# Patient Record
Sex: Male | Born: 1950 | Race: Black or African American | Hispanic: No | Marital: Single | Smoking: Never smoker
Health system: Southern US, Community
[De-identification: ages and names within clinical notes are randomized; demographics above are authoritative.]

## PROBLEM LIST (undated history)

## (undated) DIAGNOSIS — I10 Essential (primary) hypertension: Secondary | ICD-10-CM

## (undated) DIAGNOSIS — M1990 Unspecified osteoarthritis, unspecified site: Secondary | ICD-10-CM

## (undated) DIAGNOSIS — M109 Gout, unspecified: Secondary | ICD-10-CM

## (undated) HISTORY — DX: Unspecified osteoarthritis, unspecified site: M19.90

## (undated) HISTORY — PX: WRIST SURGERY: SHX841

---

## 2000-10-21 ENCOUNTER — Encounter: Payer: Self-pay | Admitting: Family Medicine

## 2000-10-21 ENCOUNTER — Ambulatory Visit (HOSPITAL_COMMUNITY): Admission: RE | Admit: 2000-10-21 | Discharge: 2000-10-21 | Payer: Self-pay | Admitting: Family Medicine

## 2001-05-07 ENCOUNTER — Emergency Department (HOSPITAL_COMMUNITY): Admission: EM | Admit: 2001-05-07 | Discharge: 2001-05-07 | Payer: Self-pay | Admitting: *Deleted

## 2001-10-26 ENCOUNTER — Emergency Department (HOSPITAL_COMMUNITY): Admission: EM | Admit: 2001-10-26 | Discharge: 2001-10-26 | Payer: Self-pay | Admitting: Emergency Medicine

## 2002-01-18 ENCOUNTER — Emergency Department (HOSPITAL_COMMUNITY): Admission: EM | Admit: 2002-01-18 | Discharge: 2002-01-18 | Payer: Self-pay | Admitting: Emergency Medicine

## 2002-08-23 ENCOUNTER — Emergency Department (HOSPITAL_COMMUNITY): Admission: EM | Admit: 2002-08-23 | Discharge: 2002-08-23 | Payer: Self-pay | Admitting: Emergency Medicine

## 2004-03-22 ENCOUNTER — Emergency Department (HOSPITAL_COMMUNITY): Admission: EM | Admit: 2004-03-22 | Discharge: 2004-03-22 | Payer: Self-pay | Admitting: Emergency Medicine

## 2006-03-28 ENCOUNTER — Emergency Department (HOSPITAL_COMMUNITY): Admission: EM | Admit: 2006-03-28 | Discharge: 2006-03-28 | Payer: Self-pay | Admitting: Emergency Medicine

## 2006-06-09 ENCOUNTER — Emergency Department (HOSPITAL_COMMUNITY): Admission: EM | Admit: 2006-06-09 | Discharge: 2006-06-10 | Payer: Self-pay | Admitting: Emergency Medicine

## 2006-08-28 ENCOUNTER — Emergency Department (HOSPITAL_COMMUNITY): Admission: EM | Admit: 2006-08-28 | Discharge: 2006-08-28 | Payer: Self-pay | Admitting: Emergency Medicine

## 2007-07-25 ENCOUNTER — Emergency Department (HOSPITAL_COMMUNITY): Admission: EM | Admit: 2007-07-25 | Discharge: 2007-07-25 | Payer: Self-pay | Admitting: Emergency Medicine

## 2007-10-09 ENCOUNTER — Emergency Department (HOSPITAL_COMMUNITY): Admission: EM | Admit: 2007-10-09 | Discharge: 2007-10-09 | Payer: Self-pay | Admitting: Emergency Medicine

## 2008-06-15 ENCOUNTER — Ambulatory Visit: Payer: Self-pay | Admitting: Internal Medicine

## 2008-06-17 ENCOUNTER — Ambulatory Visit: Payer: Self-pay | Admitting: *Deleted

## 2008-07-07 ENCOUNTER — Ambulatory Visit: Payer: Self-pay | Admitting: Internal Medicine

## 2008-07-07 LAB — CONVERTED CEMR LAB
ALT: 33 units/L (ref 0–53)
Albumin: 4.2 g/dL (ref 3.5–5.2)
CO2: 21 meq/L (ref 19–32)
Calcium: 9.3 mg/dL (ref 8.4–10.5)
Chloride: 106 meq/L (ref 96–112)
Glucose, Bld: 106 mg/dL — ABNORMAL HIGH (ref 70–99)
Potassium: 4.9 meq/L (ref 3.5–5.3)
Sodium: 139 meq/L (ref 135–145)
Total Protein: 8.8 g/dL — ABNORMAL HIGH (ref 6.0–8.3)
Uric Acid, Serum: 11.8 mg/dL — ABNORMAL HIGH (ref 4.0–7.8)

## 2008-08-12 ENCOUNTER — Ambulatory Visit: Payer: Self-pay | Admitting: Internal Medicine

## 2008-09-13 ENCOUNTER — Ambulatory Visit: Payer: Self-pay | Admitting: Internal Medicine

## 2008-09-20 ENCOUNTER — Ambulatory Visit: Payer: Self-pay | Admitting: Internal Medicine

## 2008-10-10 ENCOUNTER — Ambulatory Visit: Payer: Self-pay | Admitting: Family Medicine

## 2008-12-21 ENCOUNTER — Ambulatory Visit: Payer: Self-pay | Admitting: Internal Medicine

## 2008-12-22 ENCOUNTER — Ambulatory Visit (HOSPITAL_COMMUNITY): Admission: RE | Admit: 2008-12-22 | Discharge: 2008-12-22 | Payer: Self-pay | Admitting: Internal Medicine

## 2009-01-04 ENCOUNTER — Ambulatory Visit: Payer: Self-pay | Admitting: Internal Medicine

## 2009-01-11 ENCOUNTER — Ambulatory Visit: Payer: Self-pay | Admitting: Internal Medicine

## 2009-02-02 ENCOUNTER — Ambulatory Visit: Payer: Self-pay | Admitting: Internal Medicine

## 2009-02-10 ENCOUNTER — Encounter: Admission: RE | Admit: 2009-02-10 | Discharge: 2009-05-11 | Payer: Self-pay | Admitting: Family Medicine

## 2009-03-15 ENCOUNTER — Ambulatory Visit: Payer: Self-pay | Admitting: Internal Medicine

## 2009-03-30 ENCOUNTER — Ambulatory Visit: Payer: Self-pay | Admitting: Internal Medicine

## 2011-03-31 ENCOUNTER — Emergency Department (HOSPITAL_COMMUNITY)
Admission: EM | Admit: 2011-03-31 | Discharge: 2011-03-31 | Disposition: A | Discharge status: Home / Self Care | Payer: Medicare Other | Attending: Emergency Medicine | Admitting: Emergency Medicine

## 2011-03-31 DIAGNOSIS — M109 Gout, unspecified: Secondary | ICD-10-CM | POA: Insufficient documentation

## 2011-03-31 DIAGNOSIS — M7989 Other specified soft tissue disorders: Secondary | ICD-10-CM | POA: Insufficient documentation

## 2011-03-31 DIAGNOSIS — M25569 Pain in unspecified knee: Secondary | ICD-10-CM | POA: Insufficient documentation

## 2011-03-31 DIAGNOSIS — M25579 Pain in unspecified ankle and joints of unspecified foot: Secondary | ICD-10-CM | POA: Insufficient documentation

## 2011-05-24 LAB — I-STAT 8, (EC8 V) (CONVERTED LAB)
BUN: 24 — ABNORMAL HIGH
Bicarbonate: 22
Chloride: 106
Glucose, Bld: 119 — ABNORMAL HIGH
HCT: 36 — ABNORMAL LOW
Hemoglobin: 12.2 — ABNORMAL LOW
Operator id: 270651
Sodium: 138
pCO2, Ven: 33 — ABNORMAL LOW

## 2011-05-24 LAB — URINE MICROSCOPIC-ADD ON

## 2011-05-24 LAB — URINE CULTURE

## 2011-05-24 LAB — URINALYSIS, ROUTINE W REFLEX MICROSCOPIC
Ketones, ur: 40 — AB
Nitrite: POSITIVE — AB
Protein, ur: 100 — AB

## 2011-10-15 ENCOUNTER — Ambulatory Visit (INDEPENDENT_AMBULATORY_CARE_PROVIDER_SITE_OTHER): Payer: Medicare Other | Admitting: Family Medicine

## 2011-10-15 ENCOUNTER — Encounter: Payer: Self-pay | Admitting: Family Medicine

## 2011-10-15 DIAGNOSIS — I10 Essential (primary) hypertension: Secondary | ICD-10-CM | POA: Diagnosis not present

## 2011-10-15 DIAGNOSIS — M109 Gout, unspecified: Secondary | ICD-10-CM | POA: Diagnosis not present

## 2011-10-15 DIAGNOSIS — IMO0001 Reserved for inherently not codable concepts without codable children: Secondary | ICD-10-CM | POA: Insufficient documentation

## 2011-10-15 LAB — BASIC METABOLIC PANEL
Calcium: 9.4 mg/dL (ref 8.4–10.5)
Glucose, Bld: 107 mg/dL — ABNORMAL HIGH (ref 70–99)
Potassium: 4 mEq/L (ref 3.5–5.3)
Sodium: 139 mEq/L (ref 135–145)

## 2011-10-15 LAB — URIC ACID: Uric Acid, Serum: 8.9 mg/dL — ABNORMAL HIGH (ref 4.0–7.8)

## 2011-10-15 MED ORDER — ALLOPURINOL 100 MG PO TABS: 100.0000 mg | ORAL_TABLET | Freq: Every day | ORAL | Status: DC

## 2011-10-15 MED ORDER — INDOMETHACIN 25 MG PO CAPS: 25.0000 mg | ORAL_CAPSULE | Freq: Two times a day (BID) | ORAL | Status: DC

## 2011-10-15 NOTE — Progress Notes (Signed)
  Subjective:    Patient ID: Cody Bautista, male    DOB: 07/02/1951, 61 y.o.   MRN: [id number]  Arthritis He complains of joint swelling. Pertinent negatives include no fever.  Thsi 61 y.o AA male has a long hx of Gout which affects primarily his knees, ankles, elbows and his right sholuder.  His has changed his diet to reduce the offending agents in his diet. He has run out of the meds that were very effective-   swelling gone down and no burning sensation in his knees. Denies pain in joints today.  Re: Elevated BP- pt states he sausage 2 days ago as the reason for his elevated reading today. Also he has  stressful neighbors and he is moving into a house next week.   HCM- last CPE about 2-3 years ago at [facility] with Dr. Donia Bautista    Review of Systems  Constitutional: Negative for fever, appetite change and unexpected weight change.  Respiratory: Positive for cough. Negative for chest tightness, shortness of breath and wheezing.        C/o cough when having a bowel movement x 5-6 months  Cardiovascular: Negative.   Musculoskeletal: Positive for back pain, joint swelling, arthralgias and arthritis.  Neurological: Negative.   Psychiatric/Behavioral: Positive for sleep disturbance.       Objective:   Physical Exam  Nursing note and vitals reviewed. Constitutional: He is oriented to person, place, and time. He appears well-developed and well-nourished. No distress.       Slightly disheveled appearance  HENT:  Head: Normocephalic and atraumatic.  Eyes: Conjunctivae and EOM are normal.  Neck: Normal range of motion.  Pulmonary/Chest: Effort normal.  Musculoskeletal: Normal range of motion. He exhibits no edema and no tenderness.       Shoulders- stiffness noted in right  with decreased ROM;                    Give-way against resisitance Spasms noted in paraspinal muscles; forward flexion very limited to less than 30 degrees.  Neurological: He is  alert and oriented to person, place, and time. No cranial nerve deficit.  Skin: Skin is warm and dry.  Psychiatric: He has a normal mood and affect. His behavior is normal. Thought content normal.    Uric acid = 7.5 mg/dL  in November 2012 ESR= 120 mm/hr  in November 2012.      Assessment & Plan:   1. Gout  Uric acid; Resume Indomethacin. Will start Allopurinol at 100 mg daily pending labs.  Elevated Uric acid = 11.8 In November 2009.   2. High blood pressure  Basic metabolic panel: BP recheck= 160/98.   3.  RTC in 8 weeks for CPE and BP re-check and to review labs.

## 2011-10-15 NOTE — Patient Instructions (Signed)

## 2011-10-17 NOTE — Progress Notes (Signed)
Quick Note:  You labs are abnormal. Please call pt and let him know that his Uric acid level is lower than 3 years ago. He is to take the medications prescribed and continue diet restrictions Please send him a copy of his labs.   Thank you.  ______

## 2011-12-13 ENCOUNTER — Encounter: Payer: Medicare Other | Admitting: Family Medicine

## 2012-01-08 ENCOUNTER — Encounter: Payer: Medicare Other | Admitting: Family Medicine

## 2012-02-05 ENCOUNTER — Encounter: Payer: Medicare Other | Admitting: Family Medicine

## 2012-02-19 ENCOUNTER — Encounter: Payer: Self-pay | Admitting: Family Medicine

## 2012-02-19 ENCOUNTER — Ambulatory Visit (INDEPENDENT_AMBULATORY_CARE_PROVIDER_SITE_OTHER): Payer: Medicare Other | Admitting: Family Medicine

## 2012-02-19 VITALS — BP 104/91 | HR 84 | Temp 97.3°F | Resp 16 | Ht 67.0 in | Wt 166.2 lb

## 2012-02-19 DIAGNOSIS — Z23 Encounter for immunization: Secondary | ICD-10-CM | POA: Diagnosis not present

## 2012-02-19 DIAGNOSIS — M109 Gout, unspecified: Secondary | ICD-10-CM

## 2012-02-19 DIAGNOSIS — N400 Enlarged prostate without lower urinary tract symptoms: Secondary | ICD-10-CM | POA: Diagnosis not present

## 2012-02-19 DIAGNOSIS — K0530 Chronic periodontitis, unspecified: Secondary | ICD-10-CM

## 2012-02-19 DIAGNOSIS — G8929 Other chronic pain: Secondary | ICD-10-CM

## 2012-02-19 DIAGNOSIS — M549 Dorsalgia, unspecified: Secondary | ICD-10-CM | POA: Diagnosis not present

## 2012-02-19 DIAGNOSIS — M949 Disorder of cartilage, unspecified: Secondary | ICD-10-CM | POA: Diagnosis not present

## 2012-02-19 DIAGNOSIS — Z0000 Encounter for general adult medical examination without abnormal findings: Secondary | ICD-10-CM | POA: Diagnosis not present

## 2012-02-19 DIAGNOSIS — M899 Disorder of bone, unspecified: Secondary | ICD-10-CM | POA: Diagnosis not present

## 2012-02-19 DIAGNOSIS — M898X9 Other specified disorders of bone, unspecified site: Secondary | ICD-10-CM

## 2012-02-19 LAB — POCT URINALYSIS DIPSTICK
Glucose, UA: NEGATIVE
Leukocytes, UA: NEGATIVE
Nitrite, UA: NEGATIVE
Protein, UA: 30
Urobilinogen, UA: 0.2

## 2012-02-19 LAB — IFOBT (OCCULT BLOOD): IFOBT: NEGATIVE

## 2012-02-19 NOTE — Patient Instructions (Signed)
Gum Disease Gum disease is an infection of the tissues that surround and support the teeth (periodontium). This includes the gums, connective tissue fibers (ligaments), and the thickened ridges of the tooth bone (sockets). The disease is caused by germs (bacteria) that grow in soft deposits (plaque) on the teeth. This results in redness, soreness, and swelling (inflammation). This inflammation causes the gums to bleed. If left untreated, it can lead to damage of the tissues and supportive bone. Although bacteria are known as the major cause of gum disease, other risk factors include tobacco use, diabetes, certain medications, hormones, pregnancy, and genetic factors. SYMPTOMS   Gums that bleed easily.   Red or swollen gums.   Bad breath that does not go away.   Gums that have pulled away from the teeth.   Loose or separating permanent teeth.   Painful chewing.   Changes in the way your teeth fit together.  DIAGNOSIS  A thorough exam will be performed by a dentist to determine the presence and stage of gum disease. The stage is how far the gum disease has developed. TREATMENT  Treatment is based on the stages of gum disease. The stages include:  Mild. If it is caught early, conditions can improve by brushing and flossing properly.   Moderate. You may need special cleaning (scaling and root planing). This method removes plaque and hardened plaque (tartar) above and below the gum line. Medication may also be used to treat moderate gum disease.   Severe. This stage requires surgery of the gums and supporting bone.  PREVENTION  You can prevent gum disease by:  Practicing good oral hygiene, including brushing and flossing properly.   Avoiding use of tobacco products.   Scheduling regular dental check-ups and cleanings.   Eating a well-balanced diet.  SEEK IMMEDIATE DENTAL OR MEDICAL CARE IF:  You have fever over 102 F (38.9 C).   You have swelling of your face, neck, or jaw.    You are unable to open your mouth.   You have severe pain not controlled by pain medicine.  Document Released: 02/06/2010 Document Revised: 08/08/2011 Document Reviewed: 02/06/2010 [facility] Patient Information 2012 [place], [place]    .Keeping you healthy  Get these tests  Blood pressure- Have your blood pressure checked once a year by your healthcare provider.  Normal blood pressure is 120/80  Weight- Have your body mass index (BMI) calculated to screen for obesity.  BMI is a measure of body fat based on height and weight. You can also calculate your own BMI at [email or web address].  Cholesterol- Have your cholesterol checked every year.  Diabetes- Have your blood sugar checked regularly if you have high blood pressure, high cholesterol, have a family history of diabetes or if you are overweight.  Screening for Colon Cancer- Colonoscopy starting at age 16.  Screening may begin sooner depending on your family history and other health conditions. Follow up colonoscopy as directed by your Gastroenterologist.  Screening for Prostate Cancer- Both blood work (PSA) and a rectal exam help screen for Prostate Cancer.  Screening begins at age 76 with African-American men and at age 37 with Caucasian men.  Screening may begin sooner depending on your family history.  Take these medicines  Aspirin- One aspirin daily can help prevent Heart disease and Stroke.  Flu shot- Every fall.  Tetanus- Every 10 years.  Zostavax- Once after the age of 24 to prevent Shingles.  Pneumonia shot- Once after the age of 40; if you are younger than 8,  ask your healthcare provider if you need a Pneumonia shot.  Take these steps  Don't smoke- If you do smoke, talk to your doctor about quitting.  For tips on how to quit, go to www.smokefree.gov or call 1-800-QUIT-NOW.  Be physically active- Exercise 5 days a week for at least 30 minutes.  If you are not already physically active start slow and gradually  work up to 30 minutes of moderate physical activity.  Examples of moderate activity include walking briskly, mowing the yard, dancing, swimming, bicycling, etc.  Eat a healthy diet- Eat a variety of healthy food such as fruits, vegetables, low fat milk, low fat cheese, yogurt, lean meant, poultry, fish, beans, tofu, etc. For more information go to www.thenutritionsource.org  Drink alcohol in moderation- Limit alcohol intake to less than two drinks a day. Never drink and drive.  Dentist- Brush and floss twice daily; visit your dentist twice a year.  Depression- Your emotional health is as important as your physical health. If you're feeling down, or losing interest in things you would normally enjoy please talk to your healthcare provider.  Eye exam- Visit your eye doctor every year.  Safe sex- If you may be exposed to a sexually transmitted infection, use a condom.  Seat belts- Seat belts can save your life; always wear one.  Smoke/Carbon Monoxide detectors- These detectors need to be installed on the appropriate level of your home.  Replace batteries at least once a year.  Skin cancer- When out in the sun, cover up and use sunscreen 15 SPF or higher.  Violence- If anyone is threatening you, please tell your healthcare provider.  Living Will/ Health care power of attorney- Speak with your healthcare provider and family.

## 2012-02-20 LAB — VITAMIN D 25 HYDROXY (VIT D DEFICIENCY, FRACTURES): Vit D, 25-Hydroxy: 10 ng/mL — ABNORMAL LOW (ref 30–89)

## 2012-02-20 LAB — PSA: PSA: 0.67 ng/mL

## 2012-02-22 ENCOUNTER — Encounter: Payer: Self-pay | Admitting: Family Medicine

## 2012-02-22 DIAGNOSIS — M549 Dorsalgia, unspecified: Secondary | ICD-10-CM | POA: Insufficient documentation

## 2012-02-22 DIAGNOSIS — N400 Enlarged prostate without lower urinary tract symptoms: Secondary | ICD-10-CM | POA: Insufficient documentation

## 2012-02-22 DIAGNOSIS — K0530 Chronic periodontitis, unspecified: Secondary | ICD-10-CM | POA: Insufficient documentation

## 2012-02-22 MED ORDER — ERGOCALCIFEROL 1.25 MG (50000 UT) PO CAPS: 50000.0000 [IU] | ORAL_CAPSULE | ORAL | Status: DC

## 2012-02-22 NOTE — Addendum Note (Signed)
Addended by: Dow Adolph B on: 02/22/2012 04:11 PM   Modules accepted: Orders

## 2012-02-22 NOTE — Progress Notes (Signed)
Subjective:    Patient ID: Cody Bautista, male    DOB: [birth year], 61 y.o.   MRN: [id number]  HPI  This 61 y.o. AA male is here for Medicare Annnual Wellness Visit and medication review.  His primary medical issue is Gout and he has chronic joint pain, controlled on current medication  He is a fair historian (at best).    Review of Systems  Constitutional: Negative for fever, activity change, appetite change, fatigue and unexpected weight change.  HENT: Positive for dental problem. Negative for hearing loss, sore throat, facial swelling, mouth sores and trouble swallowing.        Very poor dentition- pt states he "mashes up food" so he can eat  Eyes: Positive for visual disturbance. Negative for photophobia, pain, discharge and redness.  Respiratory: Negative for cough, choking, chest tightness and shortness of breath.   Cardiovascular: Negative for chest pain, palpitations and leg swelling.  Gastrointestinal: Negative.   Genitourinary: Negative for dysuria, urgency, frequency, hematuria, discharge, scrotal swelling, difficulty urinating, penile pain and testicular pain.  Musculoskeletal: Positive for back pain, arthralgias and gait problem. Negative for myalgias and joint swelling.  Skin: Positive for color change.  Neurological: Negative.   Hematological: Negative.        Objective:   Physical Exam  Nursing note and vitals reviewed. Constitutional: He is oriented to person, place, and time. He appears well-developed and well-nourished. No distress.       Appears older than stated age  HENT:  Head: Normocephalic and atraumatic.  Right Ear: External ear normal.  Left Ear: External ear normal.  Mouth/Throat: Oropharynx is clear and moist.       Dentition: missing teeth with swollen, receding gums and significant plaque and caries formation  Eyes: Conjunctivae and EOM are normal. No scleral icterus.  Neck: Normal range of motion. Neck supple. No thyromegaly present.    Cardiovascular: Normal rate, regular rhythm, normal heart sounds and intact distal pulses.  Exam reveals no gallop.   No murmur heard. Pulmonary/Chest: Effort normal and breath sounds normal. No respiratory distress. He has no rales.  Abdominal: Soft. Bowel sounds are normal. He exhibits no mass. There is no tenderness. There is no guarding.       No organomegaly  Genitourinary: Rectum normal, prostate normal, testes normal and penis normal. Rectal exam shows no external hemorrhoid, no mass, no tenderness and anal tone normal. Guaiac negative stool. Prostate is not tender. Right testis shows no mass, no swelling and no tenderness. Right testis is descended. Left testis shows no mass, no swelling and no tenderness. Left testis is descended. No penile erythema. No discharge found.  Musculoskeletal: He exhibits tenderness. He exhibits no edema.       Knees: minimal swelling and tenderness; decreased ROM  Lymphadenopathy:    He has no cervical adenopathy.       Right: No inguinal adenopathy present.       Left: No inguinal adenopathy present.  Neurological: He is alert and oriented to person, place, and time. He has normal reflexes. No cranial nerve deficit. He exhibits normal muscle tone. Coordination normal.  Skin: Skin is warm and dry. No erythema.       Generally dry skin with mild eczematous changes  Psychiatric: He has a normal mood and affect. His behavior is normal. Thought content normal.       Mildly inattentive; cognitive skills average or below average     ECG: Sinus rhythm with borderline 1st degree A-V block  LABS:  November 2012-  CMET normal (gluc= 100)    Uric acid= 7.5    Assessment & Plan:   1. Routine general medical examination at a health care facility  EKG 12-Lead Advised GI referral for CRS- pt will consider this (declines referral at this time)  2. BPH (benign prostatic hypertrophy)  PSA  3. Pyorrhea gum disease -needs dental evaluation Encouraged pt to seek dental  care to improve general health  4. Gout - stable Continue Allopurinol 100 mg daily  5. Back pain, chronic  Vitamin D, 25-hydroxy  6. Bone pain - attributable to  Inadequate nutrition and gout Vitamin D, 25-hydroxy  7. Need for prophylactic vaccination with combined diphtheria-tetanus-pertussis (DTP) vaccine  Tdap vaccine greater than or equal to 7yo IM

## 2012-02-22 NOTE — Progress Notes (Signed)
Quick Note:  Please call pt and advise that the following labs are abnormal...  Vitamin D level is very low and you need to take a supplement ; Vitamin D3 50,000 IU 1 capsule once a week is at your pharmacy for pick up; this will help with bone pain and overall health.  Also remind pt to make an appt to see a Dentist in the near future.  Prostate blood test is normal.  I will see him in 4 months. ______

## 2012-06-17 ENCOUNTER — Ambulatory Visit: Payer: Medicare Other | Admitting: Family Medicine

## 2012-07-15 ENCOUNTER — Ambulatory Visit (INDEPENDENT_AMBULATORY_CARE_PROVIDER_SITE_OTHER): Payer: Medicare Other | Admitting: Family Medicine

## 2012-07-15 ENCOUNTER — Encounter: Payer: Self-pay | Admitting: Family Medicine

## 2012-07-15 VITALS — BP 120/90 | HR 69 | Temp 98.1°F | Resp 16 | Ht 68.0 in | Wt 195.4 lb

## 2012-07-15 DIAGNOSIS — M109 Gout, unspecified: Secondary | ICD-10-CM | POA: Diagnosis not present

## 2012-07-15 DIAGNOSIS — Z139 Encounter for screening, unspecified: Secondary | ICD-10-CM

## 2012-07-15 DIAGNOSIS — E559 Vitamin D deficiency, unspecified: Secondary | ICD-10-CM

## 2012-07-15 LAB — COMPREHENSIVE METABOLIC PANEL
ALT: 21 U/L (ref 0–53)
AST: 14 U/L (ref 0–37)
Alkaline Phosphatase: 42 U/L (ref 39–117)
CO2: 26 mEq/L (ref 19–32)
Creat: 1.17 mg/dL (ref 0.50–1.35)
Sodium: 137 mEq/L (ref 135–145)
Total Bilirubin: 0.8 mg/dL (ref 0.3–1.2)
Total Protein: 8 g/dL (ref 6.0–8.3)

## 2012-07-15 LAB — URIC ACID: Uric Acid, Serum: 9 mg/dL — ABNORMAL HIGH (ref 4.0–7.8)

## 2012-07-15 MED ORDER — ALLOPURINOL 100 MG PO TABS: 100.0000 mg | ORAL_TABLET | Freq: Every day | ORAL | Status: DC

## 2012-07-15 MED ORDER — INDOMETHACIN 25 MG PO CAPS: 25.0000 mg | ORAL_CAPSULE | Freq: Two times a day (BID) | ORAL | Status: DC

## 2012-07-17 ENCOUNTER — Other Ambulatory Visit: Payer: Self-pay | Admitting: Family Medicine

## 2012-07-17 MED ORDER — ERGOCALCIFEROL 1.25 MG (50000 UT) PO CAPS: 50000.0000 [IU] | ORAL_CAPSULE | ORAL | Status: DC

## 2012-07-17 MED ORDER — ALLOPURINOL 300 MG PO TABS: 300.0000 mg | ORAL_TABLET | Freq: Every day | ORAL | Status: DC

## 2012-07-17 NOTE — Progress Notes (Signed)
Quick Note:  Please call pt and advise that the following labs are abnormal... Uric acid level is high (this is the blood test for gout); I need to increase dose of gout medication - Allopurinol- to 300 mg daily. I will route this to pharmacy for pt to pick up next week. He has 100 mg tablets and can take 3 a day until he runs out. When he gets new medication, it will be 1 tablet daily.  Also, Vit D level is pretty low; keep taking Vitamin D once a week. I will refill this for another 6 months. He needs to try to get some sun exposure daily.  All other labs are normal.  Copy to pt. ______

## 2012-07-19 NOTE — Progress Notes (Signed)
S: This pt returns for Gout follow-up. He has had some joint pain in hands and feet which he attributes to gout. He is aware of which foods can aggravate this disease and admits over indulgence. He reports compliance with medications. Back pain is unchanged and pt has not been able to reduce weight.  ROS: Noncontributory.  O:  Filed Vitals:   07/15/12 1054  BP: 120/90  Pulse: 69  Temp: 98.1 F (36.7 C)  Resp: 16   GEN: In NAD: WN/WD- obese. HENT:[place]/AT; EOMI w/o icteric sclerae. Edntition fair. COR: RRR. LUNGS: Normal resp arate and effort. EXT: FEET- slightly enlarged grreat toe with minimal redness or tenderness. NEURO: A&O x 3; CNs intact. Nonfocal.  A/P:  1. Gout  Uric Acid  2. Vitamin D deficiency  Vitamin D, 25-hydroxy  3. Screening  Comprehensive metabolic panel   Continue current medications pending labs results. Advised dietary changes to reduce gout flare-up.

## 2012-12-30 ENCOUNTER — Ambulatory Visit: Payer: Medicare Other | Admitting: Family Medicine

## 2013-01-20 ENCOUNTER — Ambulatory Visit: Payer: Medicare Other | Admitting: Family Medicine

## 2013-02-03 ENCOUNTER — Encounter: Payer: Self-pay | Admitting: Family Medicine

## 2013-02-03 ENCOUNTER — Ambulatory Visit (INDEPENDENT_AMBULATORY_CARE_PROVIDER_SITE_OTHER): Payer: Medicare Other | Admitting: Family Medicine

## 2013-02-03 VITALS — BP 146/96 | HR 120 | Temp 98.6°F | Resp 16 | Ht 68.0 in | Wt 186.0 lb

## 2013-02-03 DIAGNOSIS — M109 Gout, unspecified: Secondary | ICD-10-CM

## 2013-02-03 DIAGNOSIS — I498 Other specified cardiac arrhythmias: Secondary | ICD-10-CM | POA: Diagnosis not present

## 2013-02-03 DIAGNOSIS — M25549 Pain in joints of unspecified hand: Secondary | ICD-10-CM

## 2013-02-03 DIAGNOSIS — M25569 Pain in unspecified knee: Secondary | ICD-10-CM

## 2013-02-03 DIAGNOSIS — M25562 Pain in left knee: Secondary | ICD-10-CM

## 2013-02-03 DIAGNOSIS — R000 Tachycardia, unspecified: Secondary | ICD-10-CM

## 2013-02-03 DIAGNOSIS — M79641 Pain in right hand: Secondary | ICD-10-CM

## 2013-02-03 LAB — TSH: TSH: 1.708 u[IU]/mL (ref 0.350–4.500)

## 2013-02-03 LAB — BASIC METABOLIC PANEL
Chloride: 100 mEq/L (ref 96–112)
Creat: 1.14 mg/dL (ref 0.50–1.35)
Sodium: 135 mEq/L (ref 135–145)

## 2013-02-03 LAB — URIC ACID: Uric Acid, Serum: 5.9 mg/dL (ref 4.0–7.8)

## 2013-02-03 MED ORDER — PREDNISONE 20 MG PO TABS: ORAL_TABLET | ORAL | Status: DC

## 2013-02-03 MED ORDER — INDOMETHACIN 25 MG PO CAPS: 25.0000 mg | ORAL_CAPSULE | Freq: Two times a day (BID) | ORAL | Status: DC

## 2013-02-03 MED ORDER — ALLOPURINOL 300 MG PO TABS: 300.0000 mg | ORAL_TABLET | Freq: Every day | ORAL | Status: DC

## 2013-02-03 MED ORDER — METHYLPREDNISOLONE ACETATE 40 MG/ML IJ SUSP
120.0000 mg | Freq: Once | INTRAMUSCULAR | Status: AC
  Administered 2013-02-03: 120 mg via INTRAMUSCULAR

## 2013-02-03 MED ORDER — ERGOCALCIFEROL 1.25 MG (50000 UT) PO CAPS: 50000.0000 [IU] | ORAL_CAPSULE | ORAL | Status: DC

## 2013-02-03 MED ORDER — KETOROLAC TROMETHAMINE 30 MG/ML IM SOLN
30.0000 mg | Freq: Once | INTRAMUSCULAR | Status: AC
  Administered 2013-02-03: 30 mg via INTRAMUSCULAR

## 2013-02-03 NOTE — Progress Notes (Signed)
S:  This 62 y.o. AA male has gout; he presents today w/ complaint of L knee pain and swelling and R hand swelling. He reports onset of hand symptoms after working on a vehicle. He also reports being out of gout medication and denies ingestion of any foods that may have precipitated this flare. Pt had surgery on R hand in the 1980s.   PMHx, Soc Hx and Fam Hx reviewed.  ROS: Negative for fever/chills, fatigue, CP or tightness, palpitations, SOB or DOE, redness of joint, loss of use of limbs, rashes, myalgias, back pain, paresthesias or weakness.  O:  Filed Vitals:   02/03/13 1046  BP: 146/96  Pulse: 120  Temp: 98.6 F (37 C)  Resp: 16   GEN: In NAD; WN,WD. HENT: [place]/AT; EOMI w/ clear conj and muddy sclerae. Oroph clear and moist. NCEK: Supple; no LAN or TMG. COR: Sinus tachycardia; rhythm is regular and rate down to ~100. LUNGS: CTA; no wheezes or rales. BACK: No CVAT. MS: L knee- warm to touch, swelling noted inferior to patella; ROM is good. No obvious deformity.        R hand- warm to touch, swelling noted in 2nd and 3rd MCPs; pt cannot make a complete fist. Joints are nontender. NEURO: A&O x 3; CNs intact. Nonfocal.  A/P: Hand joint pain, right - Plan: methylPREDNISolone acetate (DEPO-MEDROL) injection 120 mg, ketorolac (TORADOL) injection 30 mg  Knee pain, acute, left - Plan: methylPREDNISolone acetate (DEPO-MEDROL) injection 120 mg, ketorolac (TORADOL) injection 30 mg  Gout flare - Plan: Uric Acid; advised adequate hydration.  Sinus tachycardia - Plan: T4, Free, TSH, Basic Metabolic Panel  Meds ordered this encounter  Medications  . DISCONTD: ergocalciferol (VITAMIN D2) 50000 UNITS capsule    Sig: Take 50,000 Units by mouth once a week. PER PATIENT NEED REFILLS  . allopurinol (ZYLOPRIM) 300 MG tablet    Sig: Take 1 tablet (300 mg total) by mouth daily.    Dispense:  30 tablet    Refill:  5  . indomethacin (INDOCIN) 25 MG capsule    Sig: Take 1 capsule (25 mg total) by  mouth 2 (two) times daily with a meal.    Dispense:  60 capsule    Refill:  5  . methylPREDNISolone acetate (DEPO-MEDROL) injection 120 mg    Sig:   . ketorolac (TORADOL) injection 30 mg    Sig:   . predniSONE (DELTASONE) 20 MG tablet    Sig: Take 1 tablet by mouth daily for 5 days.    Dispense:  10 tablet    Refill:  0  . ergocalciferol (VITAMIN D2) 50000 UNITS capsule    Sig: Take 1 capsule (50,000 Units total) by mouth once a week.    Dispense:  4 capsule    Refill:  3

## 2013-02-03 NOTE — Patient Instructions (Addendum)
You have a gout flare up and have "over-worked" your joints. Take prescribed medications and rest for a few days to let your joints recover. You can apply a topical joint pain reliever 2-3 times a day. Take the Indomethacin with food or snack; this is for pain also. I will see you in 4 weeks to review labs and see if your joints are better.

## 2013-02-04 NOTE — Progress Notes (Signed)
Quick Note:  Please notify pt that results are normal.   Provide pt with copy of labs. ______ 

## 2013-02-05 ENCOUNTER — Encounter: Payer: Self-pay | Admitting: *Deleted

## 2013-03-03 ENCOUNTER — Ambulatory Visit: Payer: Medicare Other | Admitting: Family Medicine

## 2013-12-01 ENCOUNTER — Ambulatory Visit (INDEPENDENT_AMBULATORY_CARE_PROVIDER_SITE_OTHER): Payer: Medicare Other | Admitting: Family Medicine

## 2013-12-01 ENCOUNTER — Emergency Department (HOSPITAL_COMMUNITY)
Admission: EM | Admit: 2013-12-01 | Discharge: 2013-12-01 | Disposition: A | Discharge status: Home / Self Care | Payer: Medicare Other | Attending: Emergency Medicine | Admitting: Emergency Medicine

## 2013-12-01 ENCOUNTER — Encounter (HOSPITAL_COMMUNITY): Payer: Self-pay | Admitting: Emergency Medicine

## 2013-12-01 VITALS — BP 158/97 | HR 114 | Temp 98.2°F | Resp 17 | Ht 68.5 in | Wt 184.0 lb

## 2013-12-01 DIAGNOSIS — M129 Arthropathy, unspecified: Secondary | ICD-10-CM | POA: Diagnosis not present

## 2013-12-01 DIAGNOSIS — R748 Abnormal levels of other serum enzymes: Secondary | ICD-10-CM | POA: Diagnosis not present

## 2013-12-01 DIAGNOSIS — M109 Gout, unspecified: Secondary | ICD-10-CM

## 2013-12-01 DIAGNOSIS — Z791 Long term (current) use of non-steroidal anti-inflammatories (NSAID): Secondary | ICD-10-CM | POA: Insufficient documentation

## 2013-12-01 DIAGNOSIS — M25562 Pain in left knee: Secondary | ICD-10-CM

## 2013-12-01 DIAGNOSIS — M25569 Pain in unspecified knee: Secondary | ICD-10-CM

## 2013-12-01 DIAGNOSIS — R7401 Elevation of levels of liver transaminase levels: Secondary | ICD-10-CM

## 2013-12-01 DIAGNOSIS — M79609 Pain in unspecified limb: Secondary | ICD-10-CM | POA: Diagnosis not present

## 2013-12-01 DIAGNOSIS — R740 Nonspecific elevation of levels of transaminase and lactic acid dehydrogenase [LDH]: Secondary | ICD-10-CM

## 2013-12-01 DIAGNOSIS — Z79899 Other long term (current) drug therapy: Secondary | ICD-10-CM | POA: Diagnosis not present

## 2013-12-01 DIAGNOSIS — R350 Frequency of micturition: Secondary | ICD-10-CM

## 2013-12-01 DIAGNOSIS — M79671 Pain in right foot: Secondary | ICD-10-CM

## 2013-12-01 DIAGNOSIS — M79672 Pain in left foot: Principal | ICD-10-CM

## 2013-12-01 DIAGNOSIS — R7402 Elevation of levels of lactic acid dehydrogenase (LDH): Secondary | ICD-10-CM | POA: Insufficient documentation

## 2013-12-01 DIAGNOSIS — M25469 Effusion, unspecified knee: Secondary | ICD-10-CM | POA: Diagnosis not present

## 2013-12-01 DIAGNOSIS — M25561 Pain in right knee: Secondary | ICD-10-CM

## 2013-12-01 LAB — GRAM STAIN

## 2013-12-01 LAB — COMPREHENSIVE METABOLIC PANEL
ALK PHOS: 80 U/L (ref 39–117)
ALT: 89 U/L — ABNORMAL HIGH (ref 0–53)
ALT: 87 U/L — AB (ref 0–53)
AST: 39 U/L — ABNORMAL HIGH (ref 0–37)
AST: 42 U/L — ABNORMAL HIGH (ref 0–37)
Albumin: 4.1 g/dL (ref 3.5–5.2)
Albumin: 3.8 g/dL (ref 3.5–5.2)
Alkaline Phosphatase: 75 U/L (ref 39–117)
BILIRUBIN TOTAL: 1 mg/dL (ref 0.3–1.2)
BUN: 14 mg/dL (ref 6–23)
BUN: 15 mg/dL (ref 6–23)
CO2: 25 mEq/L (ref 19–32)
CO2: 23 meq/L (ref 19–32)
Calcium: 9.5 mg/dL (ref 8.4–10.5)
Calcium: 10 mg/dL (ref 8.4–10.5)
Chloride: 96 mEq/L (ref 96–112)
Chloride: 92 mEq/L — ABNORMAL LOW (ref 96–112)
Creat: 1.15 mg/dL (ref 0.50–1.35)
Creatinine, Ser: 1.15 mg/dL (ref 0.50–1.35)
GFR calc Af Amer: 78 mL/min — ABNORMAL LOW (ref >90)
GFR, EST NON AFRICAN AMERICAN: 67 mL/min — AB (ref >90)
Glucose, Bld: 95 mg/dL (ref 70–99)
Glucose, Bld: 100 mg/dL — ABNORMAL HIGH (ref 70–99)
Potassium: 4.4 mEq/L (ref 3.5–5.3)
Potassium: 4.1 mEq/L (ref 3.7–5.3)
Sodium: 132 mEq/L — ABNORMAL LOW (ref 135–145)
Sodium: 131 mEq/L — ABNORMAL LOW (ref 137–147)
Total Bilirubin: 1.2 mg/dL (ref 0.2–1.2)
Total Protein: 8.4 g/dL — ABNORMAL HIGH (ref 6.0–8.3)
Total Protein: 9.4 g/dL — ABNORMAL HIGH (ref 6.0–8.3)

## 2013-12-01 LAB — POCT URINALYSIS DIPSTICK
Blood, UA: NEGATIVE
Glucose, UA: NEGATIVE
Ketones, UA: 15
Leukocytes, UA: NEGATIVE
Nitrite, UA: NEGATIVE
Protein, UA: 100
Spec Grav, UA: >=1.03
Urobilinogen, UA: 1
pH, UA: 5.5

## 2013-12-01 LAB — SYNOVIAL CELL COUNT + DIFF, W/ CRYSTALS
Lymphocytes-Synovial Fld: 5 % (ref 0–20)
Monocyte-Macrophage-Synovial Fluid: 12 % — ABNORMAL LOW (ref 50–90)
NEUTROPHIL, SYNOVIAL: 83 % — AB (ref 0–25)
WBC, Synovial: 31971 /mm3 — ABNORMAL HIGH (ref 0–200)

## 2013-12-01 LAB — POCT CBC
Granulocyte percent: 73.3 %G (ref 37–80)
HCT, POC: 39 % — AB (ref 43.5–53.7)
Hemoglobin: 12.8 g/dL — AB (ref 14.1–18.1)
Lymph, poc: 3 (ref 0.6–3.4)
MCH, POC: 30 pg (ref 27–31.2)
MCHC: 32.8 g/dL (ref 31.8–35.4)
MCV: 91.4 fL (ref 80–97)
MID (cbc): 0.8 (ref 0–0.9)
MPV: 8 fL (ref 0–99.8)
POC Granulocyte: 10.3 — AB (ref 2–6.9)
POC LYMPH PERCENT: 21.2 %L (ref 10–50)
POC MID %: 5.5 %M (ref 0–12)
Platelet Count, POC: 424 10*3/uL (ref 142–424)
RBC: 4.27 M/uL — AB (ref 4.69–6.13)
RDW, POC: 12.2 %
WBC: 14 10*3/uL — AB (ref 4.6–10.2)

## 2013-12-01 LAB — POCT UA - MICROSCOPIC ONLY
Casts, Ur, LPF, POC: NEGATIVE
Crystals, Ur, HPF, POC: NEGATIVE
Epithelial cells, urine per micros: NEGATIVE
Mucus, UA: POSITIVE
Trichomonas, UA: POSITIVE
Yeast, UA: NEGATIVE

## 2013-12-01 LAB — CBG MONITORING, ED: GLUCOSE-CAPILLARY: 96 mg/dL (ref 70–99)

## 2013-12-01 LAB — URIC ACID: Uric Acid, Serum: 5.1 mg/dL (ref 4.0–7.8)

## 2013-12-01 LAB — RPR: RPR Ser Ql: NONREACTIVE

## 2013-12-01 LAB — GLUCOSE, SYNOVIAL FLUID: GLUCOSE, SYNOVIAL FLUID: 72 mg/dL

## 2013-12-01 LAB — GLUCOSE, POCT (MANUAL RESULT ENTRY): POC Glucose: 96 mg/dl (ref 70–99)

## 2013-12-01 MED ORDER — METRONIDAZOLE 500 MG PO TABS
2000.0000 mg | ORAL_TABLET | Freq: Once | ORAL | Status: AC
  Administered 2013-12-01: 2000 mg via ORAL
  Filled 2013-12-01: qty 4

## 2013-12-01 MED ORDER — INDOMETHACIN 50 MG PO CAPS
50.0000 mg | ORAL_CAPSULE | Freq: Once | ORAL | Status: AC
  Administered 2013-12-01: 50 mg via ORAL
  Filled 2013-12-01: qty 1

## 2013-12-01 MED ORDER — HYDROCODONE-ACETAMINOPHEN 5-325 MG PO TABS
2.0000 | ORAL_TABLET | Freq: Once | ORAL | Status: AC
  Administered 2013-12-01: 2 via ORAL
  Filled 2013-12-01: qty 2

## 2013-12-01 MED ORDER — INDOMETHACIN 25 MG PO CAPS
25.0000 mg | ORAL_CAPSULE | Freq: Two times a day (BID) | ORAL | Status: DC
Start: 2013-12-01 — End: 2016-04-10

## 2013-12-01 NOTE — ED Provider Notes (Signed)
Medical screening examination/treatment/procedure(s) were performed by non-physician practitioner and as supervising physician I was immediately available for consultation/collaboration.   EKG Interpretation None       Gilda Creasehristopher J. Pollina, MD 12/01/13 1859

## 2013-12-01 NOTE — ED Notes (Signed)
Per pt, having frequent urination.  Pt started allopurinol last week and states symptoms started at that time.  Pt states he has had fluid in his feet and knees.  No vomiting.

## 2013-12-01 NOTE — Progress Notes (Signed)
   CARE MANAGEMENT ED NOTE 12/01/2013  Patient:  Kuri,Beni   Account Number:  [id number]  Date Initiated:  12/01/2013  Documentation initiated by:  Radford PaxFERRERO,Laury Huizar  Subjective/Objective Assessment:   Patient presents to Ed with c/o frequent urination.     Subjective/Objective Assessment Detail:     Action/Plan:   Action/Plan Detail:   Anticipated DC Date:       Status Recommendation to Physician:   Result of Recommendation:    Other ED Services  Consult Working Plan    DC Planning Services  Other  PCP issues    Choice offered to / List presented to:            Status of service:  Completed, signed off  ED Comments:   ED Comments Detail:  Patient reports his pcp is Dr. Dow AdolphBarbara McPherson at [facility] [address] [place].  System updated.

## 2013-12-01 NOTE — ED Provider Notes (Addendum)
CSN: [id number]     Arrival date & time 12/01/13  1342 History   First MD Initiated Contact with Patient 12/01/13 [phone]     Chief Complaint  Patient presents with  . Dysuria     (Consider location/radiation/quality/duration/timing/severity/associated sxs/prior Treatment) The history is provided by the patient and medical records. No language interpreter was used.    Cody LastCharles Dignan is a 63 y.o. male  with a hx of gout, arthritis, BPH presents to the Emergency Department complaining of gradual, persistent, progressively worsening foot/knee pain with associated swelling onset 1.5 weeks ago.  Pt reports he has a Hx of gout and stopped taking his Allopurinol several months ago.  1.5 weeks ago his feet began to swell and hurt, moving into the knees. He restarted his Allopurinol and then several days later begin to have dark urine without dysuria.  He reports he knows which foods can aggravate this disease and admits over indulgence in these recently. Pt reports no aggravating or alleviating symptoms.  Pt denies fever, chills, headache, neck pain, neck stiffness, weakness, dizziness, syncope.  Pt reports Bautista sexual partner was 5-6 mos ago.     Past Medical History  Diagnosis Date  . Arthritis     lower back with muscle spasms   Past Surgical History  Procedure Laterality Date  . Wrist surgery  1980s   (RIGHT)    Accident at work- Location managermachine operator at [employer]   Family History  Problem Relation Age of Onset  . Kidney disease Mother     kidney stones  . Stroke Sister [id number]  . Hypertension Sister   . Stroke Brother [id number]  . Hypertension Brother   . Stroke Maternal Uncle 60  . Cirrhosis Brother    History  Substance Use Topics  . Smoking status: Never Smoker   . Smokeless tobacco: Not on file  . Alcohol Use: No    Review of Systems  Constitutional: Negative for fever, diaphoresis, appetite change, fatigue and unexpected weight change.  HENT: Negative for mouth sores.   Eyes: Negative for  visual disturbance.  Respiratory: Negative for cough, chest tightness, shortness of breath and wheezing.   Cardiovascular: Negative for chest pain.  Gastrointestinal: Negative for nausea, vomiting, abdominal pain, diarrhea and constipation.  Endocrine: Negative for polydipsia, polyphagia and polyuria.  Genitourinary: Negative for dysuria, urgency, frequency and hematuria.       Discolored urine  Musculoskeletal: Positive for arthralgias and joint swelling. Negative for back pain and neck stiffness.  Skin: Negative for rash.  Allergic/Immunologic: Negative for immunocompromised state.  Neurological: Negative for syncope, light-headedness and headaches.  Hematological: Does not bruise/bleed easily.  Psychiatric/Behavioral: Negative for sleep disturbance. The patient is not nervous/anxious.       Allergies  Review of patient's allergies indicates no known allergies.  Home Medications   Current Outpatient Rx  Name  Route  Sig  Dispense  Refill  . allopurinol (ZYLOPRIM) 300 MG tablet   Oral   Take 1 tablet (300 mg total) by mouth daily.   30 tablet   5   . indomethacin (INDOCIN) 25 MG capsule   Oral   Take 1 capsule (25 mg total) by mouth 2 (two) times daily with a meal.   60 capsule   0    BP 132/76  Pulse 94  Temp(Src) 98.3 F (36.8 C) (Oral)  Resp 18  SpO2 97% Physical Exam  Nursing note and vitals reviewed. Constitutional: He is oriented to person, place, and time. He appears well-developed and  well-nourished. No distress.  Awake, alert, nontoxic appearance  HENT:  Head: Normocephalic and atraumatic.  Mouth/Throat: Oropharynx is clear and moist. No oropharyngeal exudate.  Eyes: Conjunctivae are normal. No scleral icterus.  Neck: Normal range of motion. Neck supple.  Cardiovascular: Normal rate, regular rhythm, normal heart sounds and intact distal pulses.   No murmur heard. Capillary refill < 3 sec  Pulmonary/Chest: Effort normal and breath sounds normal. No  respiratory distress. He has no wheezes. He has no rales.  Abdominal: Soft. Bowel sounds are normal. He exhibits no distension and no mass. There is no tenderness. There is no rebound and no guarding.  Musculoskeletal: He exhibits tenderness. He exhibits no edema.       Left knee: He exhibits decreased range of motion (mild) and effusion. He exhibits no ecchymosis, no deformity, no laceration, no erythema, normal alignment, no LCL laxity, normal patellar mobility, no bony tenderness, normal meniscus and no MCL laxity. No tenderness found.       Legs: ROM: mildly decreased ROM of the L knee, full ROM of the right knee, Full ROM of the bilateral ankles and all toes.    L knee with visible and palpable effusion, no effusion noted to the right knee; no swelling noted to the ankles or toes.  Increased warmth without erythema to the left knee, no significant increased warmth to the right knee or bilateral ankles  Neurological: He is alert and oriented to person, place, and time. Coordination normal.  Sensation intact to dull and sharp Strength 5/5 in the bilateral lower extremities including dorsiflexion and plantar flexion, normal gait  Skin: Skin is warm and dry. He is not diaphoretic. No erythema.  No tenting of the skin  Psychiatric: He has a normal mood and affect.    ED Course  ARTHOCENTESIS Date/Time: 12/01/2013 5:13 PM Performed by: Dierdre Forth Authorized by: Dierdre Forth Consent: Verbal consent obtained. Risks and benefits: risks, benefits and alternatives were discussed Consent given by: patient Patient understanding: patient states understanding of the procedure being performed Patient consent: the patient's understanding of the procedure matches consent given Procedure consent: procedure consent matches procedure scheduled Relevant documents: relevant documents present and verified Site marked: the operative site was marked Required items: required blood products,  implants, devices, and special equipment available Patient identity confirmed: verbally with patient and arm band Time out: Immediately prior to procedure a "time out" was called to verify the correct patient, procedure, equipment, support staff and site/side marked as required. Indications: joint swelling,  diagnostic evaluation,  pain and possible septic joint  Body area: knee Local anesthesia used: yes Local anesthetic: co-phenylcaine spray Patient sedated: no Preparation: Patient was prepped and draped in the usual sterile fashion. Needle gauge: 18 G Ultrasound guidance: no Approach: lateral Aspirate: yellow Aspirate amount: 33 ml Patient tolerance: Patient tolerated the procedure well with no immediate complications.   (including critical care time) Labs Review Labs Reviewed  COMPREHENSIVE METABOLIC PANEL - Abnormal; Notable for the following:    Sodium 131 (*)    Chloride 92 (*)    Glucose, Bld 100 (*)    Total Protein 9.4 (*)    AST 42 (*)    ALT 87 (*)    GFR calc non Af Amer 67 (*)    GFR calc Af Amer 78 (*)    All other components within normal limits  SYNOVIAL CELL COUNT + DIFF, W/ CRYSTALS - Abnormal; Notable for the following:    Appearance-Synovial TURBID (*)  WBC, Synovial 16109 (*)    Neutrophil, Synovial 83 (*)    Monocyte-Macrophage-Synovial Fluid 12 (*)    All other components within normal limits  GRAM STAIN  BODY FLUID CULTURE  GONOCOCCUS CULTURE  GC/CHLAMYDIA PROBE AMP  RPR  HIV ANTIBODY (ROUTINE TESTING)  GLUCOSE, SYNOVIAL FLUID  CBG MONITORING, ED   Imaging Review No results found.   EKG Interpretation None          MDM   Final diagnoses:  Gout flare  Elevated transaminase level  Arthralgia of left knee   Cody Bautista presents from [facility] with concerns about discolored urine, elevated AST/ALT and leukocytosis of 14 on their CBC.  Pt also with mild urobilinogen but total bili of 1.    Pt's elevated transaminases and urobilinogen  likely 2/2 to restarting his Allopurinol after being off of it for some time.  Of concern is the pt's positive trichomoniasis in his urine.  Concern for possible articular gonorrhea.  Will aspirate Left knee joint.  6:44 PM Knee aspiration without complication and Gram stain with white blood cells and no organisms. Cell count with crystal shows monosodium urate crystals.  Patient without signs or symptoms of systemic disease. Afebrile.  Patient initially with tachycardia however I believe this was due to pain as his heart rate decreased with pain control only.  Patient without clinical signs and symptoms of septic joint.   Patient also with elevated transaminase and mild urobilinogen.  This time I do believe that these are from restarting his allopurinol. Patient with soft and nontender abdomen, no hepatosplenomegaly.  Discussed with patient the need to followup with primary care within one week for recheck and repeat labs. Also discussed reasons to return to the emergency room including fevers, new redness or continued swelling of the left knee.    It has been determined that no acute conditions requiring further emergency intervention are present at this time. The patient/guardian have been advised of the diagnosis and plan. We have discussed signs and symptoms that warrant return to the ED, such as changes or worsening in symptoms.   Vital signs are stable at discharge.   BP 132/76  Pulse 94  Temp(Src) 98.3 F (36.8 C) (Oral)  Resp 18  SpO2 97%  Patient/guardian has voiced understanding and agreed to follow-up with the PCP or specialist.      Dierdre Forth, PA-C 12/01/13 1847  Dierdre Forth, PA-C 12/01/13 2034

## 2013-12-01 NOTE — Patient Instructions (Signed)
Urine shows bilirubin and Trichomonas There is an elevated white count suggesting infection or significant inflammation Patient has severe foot pain which is as yet undiagnosed

## 2013-12-01 NOTE — Discharge Instructions (Signed)
1. Medications: Indomethacin, usual home medications 2. Treatment: rest, drink plenty of fluids, stop your allopurinol, begin your indomethacin 3. Follow Up: Please followup with your primary doctor for discussion of your diagnoses and further evaluation after today's visit within one week for further evaluation of your symptoms and repeat lab work   Gout Gout is an inflammatory arthritis caused by a buildup of uric acid crystals in the joints. Uric acid is a chemical that is normally present in the blood. When the level of uric acid in the blood is too high it can form crystals that deposit in your joints and tissues. This causes joint redness, soreness, and swelling (inflammation). Repeat attacks are common. Over time, uric acid crystals can form into masses (tophi) near a joint, destroying bone and causing disfigurement. Gout is treatable and often preventable. CAUSES  The disease begins with elevated levels of uric acid in the blood. Uric acid is produced by your body when it breaks down a naturally found substance called purines. Certain foods you eat, such as meats and fish, contain high amounts of purines. Causes of an elevated uric acid level include:  Being passed down from parent to child (heredity).  Diseases that cause increased uric acid production (such as obesity, psoriasis, and certain cancers).  Excessive alcohol use.  Diet, especially diets rich in meat and seafood.  Medicines, including certain cancer-fighting medicines (chemotherapy), water pills (diuretics), and aspirin.  Chronic kidney disease. The kidneys are no longer able to remove uric acid well.  Problems with metabolism. Conditions strongly associated with gout include:  Obesity.  High blood pressure.  High cholesterol.  Diabetes. Not everyone with elevated uric acid levels gets gout. It is not understood why some people get gout and others do not. Surgery, joint injury, and eating too much of certain foods  are some of the factors that can lead to gout attacks. SYMPTOMS   An attack of gout comes on quickly. It causes intense pain with redness, swelling, and warmth in a joint.  Fever can occur.  Often, only one joint is involved. Certain joints are more commonly involved:  Base of the big toe.  Knee.  Ankle.  Wrist.  Finger. Without treatment, an attack usually goes away in a few days to weeks. Between attacks, you usually will not have symptoms, which is different from many other forms of arthritis. DIAGNOSIS  Your caregiver will suspect gout based on your symptoms and exam. In some cases, tests may be recommended. The tests may include:  Blood tests.  Urine tests.  X-rays.  Joint fluid exam. This exam requires a needle to remove fluid from the joint (arthrocentesis). Using a microscope, gout is confirmed when uric acid crystals are seen in the joint fluid. TREATMENT  There are two phases to gout treatment: treating the sudden onset (acute) attack and preventing attacks (prophylaxis).  Treatment of an Acute Attack.  Medicines are used. These include anti-inflammatory medicines or steroid medicines.  An injection of steroid medicine into the affected joint is sometimes necessary.  The painful joint is rested. Movement can worsen the arthritis.  You may use warm or cold treatments on painful joints, depending which works best for you.  Treatment to Prevent Attacks.  If you suffer from frequent gout attacks, your caregiver may advise preventive medicine. These medicines are started after the acute attack subsides. These medicines either help your kidneys eliminate uric acid from your body or decrease your uric acid production. You may need to stay on  these medicines for a very long time.  The early phase of treatment with preventive medicine can be associated with an increase in acute gout attacks. For this reason, during the first few months of treatment, your caregiver may  also advise you to take medicines usually used for acute gout treatment. Be sure you understand your caregiver's directions. Your caregiver may make several adjustments to your medicine dose before these medicines are effective.  Discuss dietary treatment with your caregiver or dietitian. Alcohol and drinks high in sugar and fructose and foods such as meat, poultry, and seafood can increase uric acid levels. Your caregiver or dietician can advise you on drinks and foods that should be limited. HOME CARE INSTRUCTIONS   Do not take aspirin to relieve pain. This raises uric acid levels.  Only take over-the-counter or prescription medicines for pain, discomfort, or fever as directed by your caregiver.  Rest the joint as much as possible. When in bed, keep sheets and blankets off painful areas.  Keep the affected joint raised (elevated).  Apply warm or cold treatments to painful joints. Use of warm or cold treatments depends on which works best for you.  Use crutches if the painful joint is in your leg.  Drink enough fluids to keep your urine clear or pale yellow. This helps your body get rid of uric acid. Limit alcohol, sugary drinks, and fructose drinks.  Follow your dietary instructions. Pay careful attention to the amount of protein you eat. Your daily diet should emphasize fruits, vegetables, whole grains, and fat-free or low-fat milk products. Discuss the use of coffee, vitamin C, and cherries with your caregiver or dietician. These may be helpful in lowering uric acid levels.  Maintain a healthy body weight. SEEK MEDICAL CARE IF:   You develop diarrhea, vomiting, or any side effects from medicines.  You do not feel better in 24 hours, or you are getting worse. SEEK IMMEDIATE MEDICAL CARE IF:   Your joint becomes suddenly more tender, and you have chills or a fever. MAKE SURE YOU:   Understand these instructions.  Will watch your condition.  Will get help right away if you are  not doing well or get worse. Document Released: 08/16/2000 Document Revised: 12/14/2012 Document Reviewed: 04/01/2012 [facility] Patient Information 2014 [place], [place].   Trichomoniasis Trichomoniasis is an infection, caused by the Trichomonas organism, that affects both women and men. In women, the outer male genitalia and the vagina are affected. In men, the penis is mainly affected, but the prostate and other reproductive organs can also be involved. Trichomoniasis is a sexually transmitted disease (STD) and is most often passed to another person through sexual contact. The majority of people who get trichomoniasis do so from a sexual encounter and are also at risk for other STDs. CAUSES   Sexual intercourse with an infected partner.  It can be present in swimming pools or hot tubs. SYMPTOMS   Abnormal gray-green frothy vaginal discharge in women.  Vaginal itching and irritation in women.  Itching and irritation of the area outside the vagina in women.  Penile discharge with or without pain in males.  Inflammation of the urethra (urethritis), causing painful urination.  Bleeding after sexual intercourse. RELATED COMPLICATIONS  Pelvic inflammatory disease.  Infection of the uterus (endometritis).  Infertility.  Tubal (ectopic) pregnancy.  It can be associated with other STDs, including gonorrhea and chlamydia, hepatitis B, and HIV. COMPLICATIONS DURING PREGNANCY  Early (premature) delivery.  Premature rupture of the membranes (PROM).  Low  birth weight. DIAGNOSIS   Visualization of Trichomonas under the microscope from the vagina discharge.  Ph of the vagina greater than 4.5, tested with a test tape.  Trich Rapid Test.  Culture of the organism, but this is not usually needed.  It may be found on a Pap test.  Having a "strawberry cervix,"which means the cervix looks very red like a strawberry. TREATMENT   You may be given medication to fight the infection.  Inform your caregiver if you could be or are pregnant. Some medications used to treat the infection should not be taken during pregnancy.  Over-the-counter medications or creams to decrease itching or irritation may be recommended.  Your sexual partner will need to be treated if infected. HOME CARE INSTRUCTIONS   Take all medication prescribed by your caregiver.  Take over-the-counter medication for itching or irritation as directed by your caregiver.  Do not have sexual intercourse while you have the infection.  Do not douche or wear tampons.  Discuss your infection with your partner, as your partner may have acquired the infection from you. Or, your partner may have been the person who transmitted the infection to you.  Have your sex partner examined and treated if necessary.  Practice safe, informed, and protected sex.  See your caregiver for other STD testing. SEEK MEDICAL CARE IF:   You still have symptoms after you finish the medication.  You have an oral temperature above 102 F (38.9 C).  You develop belly (abdominal) pain.  You have pain when you urinate.  You have bleeding after sexual intercourse.  You develop a rash.  The medication makes you sick or makes you throw up (vomit). Document Released: 02/12/2001 Document Revised: 11/11/2011 Document Reviewed: 03/10/2009 [facility] Patient Information 2014 [place], [place].

## 2013-12-01 NOTE — Progress Notes (Addendum)
Subjective:    Patient ID: Cody Bautista, male    DOB: 1951-06-10, 63 y.o.   MRN: [id number]  This chart was scribed for Elvina SidleKurt Lauenstein, MD by Blanchard KelchNicole Curnes, ED Scribe.   Chief Complaint  Patient presents with   Knee Pain    patient states he has gout    Hip Pain    patient states he has gout     PCP: No primary provider on file.   HPI  Cody Bautista is a 63 y.o. male who presents to office complaining of pain to the dorsum of his feet and knees bilaterally. He reports a history of gout and states this pain feel similar. However, he denies any prior pain this severe.   He also states that his urine has been dark recently and he has been having frequency intermittently for awhile.   He has a skin tag on his back that he states does not bother him. His PCP is Dr. Audria NineMcPherson but he has not been seen in 14 months.   He lives with his son. His son goes to school and is working at [employer].  Past Medical History  Diagnosis Date   Arthritis     lower back with muscle spasms     Review of Systems  Consitutional: No fever, chills, fatigue, night sweats, lymphadenopathy, or weight changes. Eyes: No visual changes, eye redness, or discharge. ENT/Mouth: Ears: No otalgia, tinnitus, hearing loss, discharge. Nose: No congestion, rhinorrhea, sinus pain, or epistaxis. Throat: No sore throat, post nasal drip, or teeth pain. Cardiovascular: No CP, palpitations, diaphoresis, DOE, edema, orthopnea, PND. Respiratory: No cough, hemoptysis, SOB, or wheezing. Gastrointestinal: No anorexia, dysphagia, reflux, pain, nausea, vomiting, hematemesis, diarrhea, constipation, BRBPR, or melena. Genitourinary: No hematuria, incontinence, nocturia, decreased urinary stream, discharge, impotence, or testicular pain/masses. Positive for frequency, darkened urine color Musculoskeletal: Positive for arthralgias (feet, knees) with swelling Skin: No rash, erythema, lesion changes, pain, warmth, jaundice, or  pruritis. Neurological: No headache, dizziness, syncope, seizures, tremors, memory loss, coordination problems, or paresthesias. Psychological: No anxiety, depression, hallucinations, SI/HI. Endocrine: No fatigue, polydipsia, polyphagia or polyuria. All other systems were reviewed and are otherwise negative.      Objective:   Physical Exam  General: male in no acute distress; appearance consistent with age of record; patient has horrible hygiene and foul odor.  HENT: normocephalic; atraumatic Eyes: pupils equal, round and reactive to light; extraocular muscles intact Neck: supple Heart: regular rate and rhythm; no murmurs, rubs or gallops Lungs: clear to auscultation bilaterally Abdomen: soft; nondistended; nontender; no masses or hepatosplenomegaly; bowel sounds present Extremities: No deformity; full range of motion; pulses normal Neurologic: Awake, alert and oriented; motor function intact in all extremities and symmetric; no facial droop Skin: Warm and dry; dry scaly skin between toes of both feet and has mild erythema on the dorsum of his right foot with no significant edema or swelling  Psychiatric: Normal mood and flat  affect  Results for orders placed in visit on 02/03/13  T4, FREE      Result Value Ref Range   Free T4 1.15  0.80 - 1.80 ng/dL  TSH      Result Value Ref Range   TSH 1.708  0.350 - 4.500 uIU/mL  URIC ACID      Result Value Ref Range   Uric Acid, Serum 5.9  4.0 - 7.8 mg/dL  BASIC METABOLIC PANEL      Result Value Ref Range   Sodium 135  135 - 145  mEq/L   Potassium 4.3  3.5 - 5.3 mEq/L   Chloride 100  96 - 112 mEq/L   CO2 22  19 - 32 mEq/L   Glucose, Bld 94  70 - 99 mg/dL   BUN 12  6 - 23 mg/dL   Creat 1.61  0.96 - 0.45 mg/dL   Calcium 9.5  8.4 - 40.9 mg/dL   Results for orders placed in visit on 12/01/13  POCT CBC      Result Value Ref Range   WBC 14.0 (*) 4.6 - 10.2 K/uL   Lymph, poc 3.0  0.6 - 3.4   POC LYMPH PERCENT 21.2  10 - 50 %L   MID  (cbc) 0.8  0 - 0.9   POC MID % 5.5  0 - 12 %M   POC Granulocyte 10.3 (*) 2 - 6.9   Granulocyte percent 73.3  37 - 80 %G   RBC 4.27 (*) 4.69 - 6.13 M/uL   Hemoglobin 12.8 (*) 14.1 - 18.1 g/dL   HCT, POC 81.1 (*) 91.4 - 53.7 %   MCV 91.4  80 - 97 fL   MCH, POC 30.0  27 - 31.2 pg   MCHC 32.8  31.8 - 35.4 g/dL   RDW, POC 78.2     Platelet Count, POC 424  142 - 424 K/uL   MPV 8.0  0 - 99.8 fL  GLUCOSE, POCT (MANUAL RESULT ENTRY)      Result Value Ref Range   POC Glucose 96  70 - 99 mg/dl  POCT UA - MICROSCOPIC ONLY      Result Value Ref Range   WBC, Ur, HPF, POC 0-2     RBC, urine, microscopic 1-2     Bacteria, U Microscopic 1+     Mucus, UA positive     Epithelial cells, urine per micros neg     Crystals, Ur, HPF, POC neg     Casts, Ur, LPF, POC neg     Yeast, UA neg     Trichomonas, UA Positive    POCT URINALYSIS DIPSTICK      Result Value Ref Range   Color, UA copper     Clarity, UA cloudy     Glucose, UA neg     Bilirubin, UA moderate     Ketones, UA 15     Spec Grav, UA >=1.030     Blood, UA neg     pH, UA 5.5     Protein, UA 100     Urobilinogen, UA 1.0     Nitrite, UA neg     Leukocytes, UA Negative     Patient can barely walk around the office. I am really concerned about him managing in a car, so I called his son who said he would come over and pick up his father and taken to the emergency room.     Assessment & Plan:   I personally performed the services described in this documentation, which was scribed in my presence. The recorded information has been reviewed and is accurate.  The elevated bilirubin in the urine combined with the Trichomonas in the undiagnosed foot pain in the context of an elevated white count but this person at high risk for multisystem disease. I don't think he is safe going home without further evaluation. Aref  Son will come in he came up and taken to [facility] long emergency room for further evaluation of the elevated bilirubin in the  urine and treatment of the  Trichomonas. She'll also need further evaluation of the foot pain.  Signed, Sheila Oats.D.

## 2013-12-02 LAB — GC/CHLAMYDIA PROBE AMP
CT PROBE, AMP APTIMA: NEGATIVE
GC Probe RNA: NEGATIVE

## 2013-12-02 LAB — HIV ANTIBODY (ROUTINE TESTING W REFLEX): HIV: NONREACTIVE

## 2013-12-04 LAB — GONOCOCCUS CULTURE
CULTURE: NO GROWTH
SPECIAL REQUESTS: NORMAL

## 2013-12-04 NOTE — ED Provider Notes (Signed)
Medical screening examination/treatment/procedure(s) were performed by non-physician practitioner and as supervising physician I was immediately available for consultation/collaboration.   EKG Interpretation None        Christopher J. Pollina, MD 12/04/13 0852 

## 2013-12-05 LAB — BODY FLUID CULTURE
Culture: NO GROWTH
Special Requests: NORMAL

## 2013-12-16 ENCOUNTER — Ambulatory Visit (INDEPENDENT_AMBULATORY_CARE_PROVIDER_SITE_OTHER): Payer: Medicare Other | Admitting: Family Medicine

## 2013-12-16 ENCOUNTER — Encounter: Payer: Self-pay | Admitting: Family Medicine

## 2013-12-16 VITALS — BP 144/83 | HR 78 | Temp 97.9°F | Resp 16 | Ht 68.0 in | Wt 194.0 lb

## 2013-12-16 DIAGNOSIS — R945 Abnormal results of liver function studies: Secondary | ICD-10-CM

## 2013-12-16 DIAGNOSIS — R7989 Other specified abnormal findings of blood chemistry: Secondary | ICD-10-CM

## 2013-12-16 DIAGNOSIS — M109 Gout, unspecified: Secondary | ICD-10-CM | POA: Diagnosis not present

## 2013-12-16 MED ORDER — ALLOPURINOL 300 MG PO TABS: 300.0000 mg | ORAL_TABLET | Freq: Every day | ORAL | Status: DC

## 2013-12-16 NOTE — Patient Instructions (Signed)
Gout  Gout is when your joints become red, sore, and swell (inflammed). This is caused by the buildup of uric acid crystals in the joints. Uric acid is a chemical that is normally in the blood. If the level of uric acid gets too high in the blood, these crystals form in your joints and tissues. Over time, these crystals can form into masses near the joints and tissues. These masses can destroy bone and cause the bone to look misshapen (deformed).  HOME CARE   · Do not take aspirin for pain.  · Only take medicine as told by your doctor.  · Rest the joint as much as you can. When in bed, keep sheets and blankets off painful areas.  · Keep the sore joints raised (elevated).  · Put warm or cold packs on painful joints. Use of warm or cold packs depends on which works best for you.  · Use crutches if the painful joint is in your leg.  · Drink enough fluids to keep your pee (urine) clear or pale yellow. Limit alcohol, sugary drinks, and drinks with fructose in them.  · Follow your diet instructions. Pay careful attention to how much protein you eat. Include fruits, vegetables, whole grains, and fat-free or low-fat milk products in your daily diet. Talk to your doctor or dietician about the use of coffee, vitamin C, and cherries. These may help lower uric acid levels.  · Keep a healthy body weight.  GET HELP RIGHT AWAY IF:   · You have watery poop (diarrhea), throw up (vomit), or have any side effects from medicines.  · You do not feel better in 24 hours, or you are getting worse.  · Your joint becomes suddenly more tender, and you have chills or a fever.  MAKE SURE YOU:   · Understand these instructions.  · Will watch your condition.  · Will get help right away if you are not doing well or get worse.  Document Released: 05/28/2008 Document Revised: 12/14/2012 Document Reviewed: 11/27/2009  ExitCare® Patient Information ©2014 [employer].

## 2013-12-17 NOTE — Progress Notes (Signed)
S:  This 63 y.o. AA male returns for follow-up after gout flare. He was not taking Allopurinol prior to attack; he had flare within days of restarting medication. Evaluation at [facility] on 12/01/2013 resulted in ED evaluation. Arthrocentesis of L knee was performed w/ some pain relief; synovial fluid was turbid w/ 30K+ WBCs and urate crystals. Pt discharged home on appropriate medications.  Today, knee is almost normal and pt can ambulate normally. Of note, he had abnormal labs (CMET was performed at 102 and repeated w/in 12 hours while in ED).  Patient Active Problem List   Diagnosis Date Noted  . 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 BPH (benign prostatic hypertrophy) 02/22/2012  . Back pain, chronic 02/22/2012  . Gout 10/15/2011  . Elevated blood pressure 10/15/2011   PMHx, Surg Hx, Soc and Fam Hx reviewed.    MEDICATIONS reconciled.  ROS: Negative for fever/chills, anorexia, rash, GI upset or abd pain, HA, dizziness or weakness.  O: Filed Vitals:   12/16/13 1409  BP: 144/83  Pulse: 78  Temp: 97.9 F (36.6 C)  Resp: 16   GEN: In NAD; WN,WD. HENT: [place]/AT; EOMI w/ clear conj and muddy sclerae. Otherwise unremarkable. COR: RRR. LUNGS: Unlabored resp. SKIN: W&D; intact w/o lesions or rashes. MS: L knee w/ minimal swelling, warmth or tenderness. Good ROM. Gait is normal. NEURO: A&O x 3; CNs intact. Nonfocal.  A/P: Gout- Continue current medications. Discussed dietary restrictions. Increase water intake.  Elevated LFTs- Recheck labs at follow-up visit.  Meds ordered this encounter  Medications  . allopurinol (ZYLOPRIM) 300 MG tablet    Sig: Take 1 tablet (300 mg total) by mouth daily.    Dispense:  30 tablet    Refill:  5

## 2014-03-31 ENCOUNTER — Encounter: Payer: Medicare Other | Admitting: Family Medicine

## 2014-05-06 ENCOUNTER — Encounter: Payer: Medicare Other | Admitting: Family Medicine

## 2015-01-06 ENCOUNTER — Telehealth: Payer: Self-pay | Admitting: Family Medicine

## 2015-01-06 NOTE — Telephone Encounter (Signed)
lmom to call and reschedule his appt with another provider Dr Conley RollsLe isn't doing anymore appt

## 2015-02-03 ENCOUNTER — Encounter: Payer: Medicare Other | Admitting: Family Medicine

## 2015-02-16 ENCOUNTER — Encounter: Payer: Self-pay | Admitting: Physician Assistant

## 2015-04-13 ENCOUNTER — Encounter: Payer: Medicare Other | Admitting: Physician Assistant

## 2016-04-10 ENCOUNTER — Other Ambulatory Visit: Payer: Self-pay | Admitting: Physician Assistant

## 2016-04-10 ENCOUNTER — Ambulatory Visit (INDEPENDENT_AMBULATORY_CARE_PROVIDER_SITE_OTHER): Payer: Medicare Other | Admitting: Physician Assistant

## 2016-04-10 ENCOUNTER — Encounter: Payer: Self-pay | Admitting: Physician Assistant

## 2016-04-10 ENCOUNTER — Ambulatory Visit (INDEPENDENT_AMBULATORY_CARE_PROVIDER_SITE_OTHER): Payer: Medicare Other

## 2016-04-10 VITALS — BP 154/80 | HR 127 | Temp 97.5°F | Resp 16 | Ht 68.5 in | Wt 197.2 lb

## 2016-04-10 DIAGNOSIS — M25562 Pain in left knee: Secondary | ICD-10-CM

## 2016-04-10 DIAGNOSIS — M25462 Effusion, left knee: Secondary | ICD-10-CM

## 2016-04-10 DIAGNOSIS — M179 Osteoarthritis of knee, unspecified: Secondary | ICD-10-CM | POA: Diagnosis not present

## 2016-04-10 MED ORDER — TRAMADOL HCL 50 MG PO TABS: 50.0000 mg | ORAL_TABLET | Freq: Three times a day (TID) | ORAL | 0 refills | Status: DC | PRN

## 2016-04-10 MED ORDER — COLCHICINE 0.6 MG PO TABS: ORAL_TABLET | ORAL | 1 refills | Status: DC

## 2016-04-10 MED ORDER — INDOMETHACIN 25 MG PO CAPS: 50.0000 mg | ORAL_CAPSULE | Freq: Three times a day (TID) | ORAL | 1 refills | Status: DC

## 2016-04-10 NOTE — Progress Notes (Signed)
[facility] [address] [place] [address] [phone]  By signing my name below I, Raven Small, attest that this documentation has been prepared under the direction and in the presence of Trena Platt PA. Electonically Signed. Raven Small, Scribe 04/10/2016 at 9:26 AM  Date:  04/10/2016   Name:  Cody Bautista   DOB:  December 28, 1950   MRN:  [id number]  PCP:  Dow Adolph, MD    History of Present Illness: Chief Complaint  Patient presents with   Knee Pain    gout in left knee    Cody Bautista is a 65 y.o. male with hx of gout patient who presents to [facility] with left knee pain that began yesterday. Pt was found to have left knee gout in 2015 after arthrocentesis performed by the ED.     He denies injury to left knee. Pt reports pain walking and bearing weight and has been ambulating with a cane, which is new for him. He reports warmth to left knee. He has not tried at home treatments. He notes having fluid drawn from knee in the past. He denies fever, chills, numbness and tingling.  He states not a lot of alcohol use.      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   BPH (benign prostatic hypertrophy) 02/22/2012   Back pain, chronic 02/22/2012   Gout 10/15/2011   Elevated blood pressure 10/15/2011    Past Medical History:  Diagnosis Date   Arthritis    lower back with muscle spasms    Past Surgical History:  Procedure Laterality Date   WRIST SURGERY  1980s   (RIGHT)   Accident at work- Location manager at [employer]    Social History  Substance Use Topics   Smoking status: Never Smoker   Smokeless tobacco: Not on file   Alcohol use No    Family History  Problem Relation Age of Onset   Kidney disease Mother     kidney stones   Stroke Sister 62   Hypertension Sister    Stroke Brother 59   Hypertension Brother    Stroke Maternal Uncle 60   Cirrhosis Brother     No Known  Allergies  Medication list has been reviewed and updated.  Current Outpatient Prescriptions on File Prior to Visit  Medication Sig Dispense Refill   allopurinol (ZYLOPRIM) 300 MG tablet Take 1 tablet (300 mg total) by mouth daily. 30 tablet 5   indomethacin (INDOCIN) 25 MG capsule Take 1 capsule (25 mg total) by mouth 2 (two) times daily with a meal. 60 capsule 0   No current facility-administered medications on file prior to visit.     Review of Systems  Constitutional: Negative for chills and fever.  Musculoskeletal: Positive for joint pain.  Neurological: Negative for tingling.   ROS unremarkable unless otherwise specified.  Physical Examination: BP (!) 154/80 (BP Location: Right Arm, Patient Position: Sitting, Cuff Size: Large)    Pulse (!) 127    Temp 97.5 F (36.4 C) (Oral)    Resp 16    Ht 5' 8.5" (1.74 m)    Wt 197 lb 3.2 oz (89.4 kg)    SpO2 98%    BMI 29.55 kg/m  Ideal Body Weight: ([id number])@  Physical Exam  Constitutional: He is oriented to person, place, and time. He appears well-developed and well-nourished. No distress.  HENT:  Head: Normocephalic and atraumatic.  Eyes: Conjunctivae and EOM are normal.  Pupils are equal, round, and reactive to light.  Cardiovascular: Normal rate.   Pulmonary/Chest: Effort normal. No respiratory distress.  Musculoskeletal:       Left knee: He exhibits no erythema, no LCL laxity and no MCL laxity. No medial joint line and no lateral joint line tenderness noted.  Left knee- warm to touch. No erythema. Diffuse swelling throughout knee. Lateral tenderness lateral to patellar. No joint line tenderness. No joint laxity. Negative anterior drawer test. Diminished  ROM with flexion and extension. 3/5 resisted strength.  Neurological: He is alert and oriented to person, place, and time.  Skin: Skin is warm and dry. He is not diaphoretic.  Psychiatric: He has a normal mood and affect. His behavior is normal.   Dg Knee Complete 4  Views Left  Result Date: 04/10/2016 CLINICAL DATA:  Left knee pain and swelling. EXAM: LEFT KNEE - 3 VIEW COMPARISON:  None. FINDINGS: A moderate size knee joint effusion is seen. Mild tricompartmental degenerative spurring is seen without significant joint space narrowing. No evidence of fracture or other bone lesions. IMPRESSION: Mild tricompartmental degenerative spurring and moderate knee joint effusion. Electronically Signed   By: Myles RosenthalJohn  Stahl M.D.   On: 04/10/2016 10:17  Procedure: cleansed with alcohol pad.  Cleansed several times with povidine swab.  Ethyl chloride placed at the injection site.  Aspiration began at location.  44cc of synovial fluid withdrawn from the left knee.  Cleansed with alcohol pad. And small bandage placed.  Assessment and Plan: Cody Bautista is a 65 y.o. male who is here today for left knee pain. This appears to be gout.  Synovial fluid sent to culture.  Appears gout.  Given one time use of colchicine and tramadol. Advised to return in clinic for worsening symptoms.   Left knee pain - Plan: DG Knee Complete 4 Views Left, Body fluid culture, Gram stain, Synovial cell count + diff, w/ crystals  Knee swelling, left - Plan: DG Knee Complete 4 Views Left, Body fluid culture, Gram stain, Synovial cell count + diff, w/ crystals  Knee effusion, left - Plan: Gram stain, Synovial cell count + diff, w/ crystals  Trena PlattStephanie English, PA-C [facility] 04/10/2016 9:26 AM

## 2016-04-10 NOTE — Patient Instructions (Addendum)
   IF you received an x-ray today, you will receive an invoice from [facility]. Please contact  Radiology at [phone] with questions or concerns regarding your invoice.   IF you received labwork today, you will receive an invoice from [facility]. Please contact [facility] at [phone] with questions or concerns regarding your invoice.   Our billing staff will not be able to assist you with questions regarding bills from these companies.  You will be contacted with the lab results as soon as they are available. The fastest way to get your results is to activate your My Chart account. Instructions are located on the last page of this paperwork. If you have not heard from us regarding the results in 2 weeks, please contact this office.     Gout Gout is an inflammatory arthritis caused by a buildup of uric acid crystals in the joints. Uric acid is a chemical that is normally present in the blood. When the level of uric acid in the blood is too high it can form crystals that deposit in your joints and tissues. This causes joint redness, soreness, and swelling (inflammation). Repeat attacks are common. Over time, uric acid crystals can form into masses (tophi) near a joint, destroying bone and causing disfigurement. Gout is treatable and often preventable. CAUSES  The disease begins with elevated levels of uric acid in the blood. Uric acid is produced by your body when it breaks down a naturally found substance called purines. Certain foods you eat, such as meats and fish, contain high amounts of purines. Causes of an elevated uric acid level include:  Being passed down from parent to child (heredity).  Diseases that cause increased uric acid production (such as obesity, psoriasis, and certain cancers).  Excessive alcohol use.  Diet, especially diets rich in meat and seafood.  Medicines, including certain cancer-fighting medicines  (chemotherapy), water pills (diuretics), and aspirin.  Chronic kidney disease. The kidneys are no longer able to remove uric acid well.  Problems with metabolism. Conditions strongly associated with gout include:  Obesity.  High blood pressure.  High cholesterol.  Diabetes. Not everyone with elevated uric acid levels gets gout. It is not understood why some people get gout and others do not. Surgery, joint injury, and eating too much of certain foods are some of the factors that can lead to gout attacks. SYMPTOMS   An attack of gout comes on quickly. It causes intense pain with redness, swelling, and warmth in a joint.  Fever can occur.  Often, only one joint is involved. Certain joints are more commonly involved:  Base of the big toe.  Knee.  Ankle.  Wrist.  Finger. Without treatment, an attack usually goes away in a few days to weeks. Between attacks, you usually will not have symptoms, which is different from many other forms of arthritis. DIAGNOSIS  Your caregiver will suspect gout based on your symptoms and exam. In some cases, tests may be recommended. The tests may include:  Blood tests.  Urine tests.  X-rays.  Joint fluid exam. This exam requires a needle to remove fluid from the joint (arthrocentesis). Using a microscope, gout is confirmed when uric acid crystals are seen in the joint fluid. TREATMENT  There are two phases to gout treatment: treating the sudden onset (acute) attack and preventing attacks (prophylaxis).  Treatment of an Acute Attack.  Medicines are used. These include anti-inflammatory medicines or steroid medicines.  An injection of steroid medicine into the affected   joint is sometimes necessary.  The painful joint is rested. Movement can worsen the arthritis.  You may use warm or cold treatments on painful joints, depending which works best for you.  Treatment to Prevent Attacks.  If you suffer from frequent gout attacks, your  caregiver may advise preventive medicine. These medicines are started after the acute attack subsides. These medicines either help your kidneys eliminate uric acid from your body or decrease your uric acid production. You may need to stay on these medicines for a very long time.  The early phase of treatment with preventive medicine can be associated with an increase in acute gout attacks. For this reason, during the first few months of treatment, your caregiver may also advise you to take medicines usually used for acute gout treatment. Be sure you understand your caregiver's directions. Your caregiver may make several adjustments to your medicine dose before these medicines are effective.  Discuss dietary treatment with your caregiver or dietitian. Alcohol and drinks high in sugar and fructose and foods such as meat, poultry, and seafood can increase uric acid levels. Your caregiver or dietitian can advise you on drinks and foods that should be limited. HOME CARE INSTRUCTIONS   Do not take aspirin to relieve pain. This raises uric acid levels.  Only take over-the-counter or prescription medicines for pain, discomfort, or fever as directed by your caregiver.  Rest the joint as much as possible. When in bed, keep sheets and blankets off painful areas.  Keep the affected joint raised (elevated).  Apply warm or cold treatments to painful joints. Use of warm or cold treatments depends on which works best for you.  Use crutches if the painful joint is in your leg.  Drink enough fluids to keep your urine clear or pale yellow. This helps your body get rid of uric acid. Limit alcohol, sugary drinks, and fructose drinks.  Follow your dietary instructions. Pay careful attention to the amount of protein you eat. Your daily diet should emphasize fruits, vegetables, whole grains, and fat-free or low-fat milk products. Discuss the use of coffee, vitamin C, and cherries with your caregiver or dietitian. These  may be helpful in lowering uric acid levels.  Maintain a healthy body weight. SEEK MEDICAL CARE IF:   You develop diarrhea, vomiting, or any side effects from medicines.  You do not feel better in 24 hours, or you are getting worse. SEEK IMMEDIATE MEDICAL CARE IF:   Your joint becomes suddenly more tender, and you have chills or a fever. MAKE SURE YOU:   Understand these instructions.  Will watch your condition.  Will get help right away if you are not doing well or get worse.   This information is not intended to replace advice given to you by your health care provider. Make sure you discuss any questions you have with your health care provider.   Document Released: 08/16/2000 Document Revised: 09/09/2014 Document Reviewed: 04/01/2012 Elsevier Interactive Patient Education 2016 [employer]  

## 2016-04-11 LAB — SYNOVIAL CELL COUNT + DIFF, W/ CRYSTALS
Basophils, %: 0 %
Eosinophils-Synovial: 0 % (ref 0–2)
Lymphocytes-Synovial Fld: 1 % (ref 0–74)
Monocyte/Macrophage: 5 % (ref 0–69)
Neutrophil, Synovial: 94 % — ABNORMAL HIGH (ref 0–24)
SYNOVIOCYTES, %: 0 % (ref 0–15)
WBC, Synovial: 48360 cells/uL — ABNORMAL HIGH (ref <150)

## 2016-04-11 LAB — GRAM STAIN: Gram Stain: NONE SEEN

## 2016-04-14 LAB — BODY FLUID CULTURE
Gram Stain: NONE SEEN
Organism ID, Bacteria: NO GROWTH

## 2016-04-22 ENCOUNTER — Encounter: Payer: Self-pay | Admitting: *Deleted

## 2016-07-22 ENCOUNTER — Ambulatory Visit (INDEPENDENT_AMBULATORY_CARE_PROVIDER_SITE_OTHER): Payer: Medicare Other

## 2016-07-22 ENCOUNTER — Ambulatory Visit (INDEPENDENT_AMBULATORY_CARE_PROVIDER_SITE_OTHER): Payer: Medicare Other | Admitting: Urgent Care

## 2016-07-22 VITALS — BP 120/74 | HR 98 | Temp 98.7°F | Resp 18

## 2016-07-22 DIAGNOSIS — M1712 Unilateral primary osteoarthritis, left knee: Secondary | ICD-10-CM

## 2016-07-22 DIAGNOSIS — Z8739 Personal history of other diseases of the musculoskeletal system and connective tissue: Secondary | ICD-10-CM

## 2016-07-22 DIAGNOSIS — M25562 Pain in left knee: Secondary | ICD-10-CM

## 2016-07-22 DIAGNOSIS — M25462 Effusion, left knee: Secondary | ICD-10-CM | POA: Diagnosis not present

## 2016-07-22 DIAGNOSIS — M179 Osteoarthritis of knee, unspecified: Secondary | ICD-10-CM | POA: Diagnosis not present

## 2016-07-22 LAB — SYNOVIAL CELL COUNT + DIFF, W/ CRYSTALS
BASOPHILS, %: 0 %
EOSINOPHILS-SYNOVIAL: 0 % (ref 0–2)
Lymphocytes-Synovial Fld: 13 % (ref 0–74)
Monocyte/Macrophage: 0 % (ref 0–69)
Neutrophil, Synovial: 87 % — ABNORMAL HIGH (ref 0–24)
SYNOVIOCYTES, %: 0 % (ref 0–15)
WBC, Synovial: 175 cells/uL — ABNORMAL HIGH (ref <150)

## 2016-07-22 MED ORDER — TRIAMCINOLONE ACETONIDE 40 MG/ML IJ SUSP: 40.0000 mg | Freq: Once | INTRAMUSCULAR | Status: AC

## 2016-07-22 NOTE — Progress Notes (Signed)
    MRN: [id number] DOB: 11/22/1950  Subjective:   Cody Bautista is a 65 y.o. male presenting for chief complaint of Fluid On Knee (left knee x3 days)  Reports 3 day history of left knee swelling, pain, redness, warmth. He has had this problem before, last episode was in 04/10/2016. He was diagnosed with gout following knee aspiration. He was started on colchicine, Allopurinol. For now, he is taking Allopurinol. Reports that he eats fish twice a week, drinks 2 beers on occasion. Denies fever, trauma, drainage of pus or bleeding.  Cody Bautista has a current medication list which includes the following prescription(s): allopurinol, colchicine, indomethacin, and tramadol. Also has No Known Allergies.  Cody Bautista  has a past medical history of Arthritis. Also  has a past surgical history that includes Wrist surgery (1980s   (RIGHT)).   Objective:   Vitals: BP 120/74   Pulse 98   Temp 98.7 F (37.1 C) (Oral)   Resp 18   SpO2 98%   Physical Exam  Constitutional: He is oriented to person, place, and time. He appears well-developed and well-nourished.  Cardiovascular: Normal rate.   Pulmonary/Chest: Effort normal.  Musculoskeletal:       Left knee: He exhibits decreased range of motion (flexion, extension), swelling (diffuse) and erythema. He exhibits no ecchymosis, no deformity, no laceration, normal alignment and normal patellar mobility. Tenderness found. Medial joint line, lateral joint line and patellar tendon tenderness noted.  Neurological: He is alert and oriented to person, place, and time.   Dg Knee Complete 4 Views Left  Result Date: 07/22/2016 CLINICAL DATA:  Three day history of knee pain and swelling, initial encounter EXAM: LEFT KNEE - COMPLETE 4+ VIEW COMPARISON:  04/10/2016 FINDINGS: No acute fracture or dislocation is noted. Minimal joint effusion is noted. Tricompartmental degenerative changes are seen similar to that noted on the prior exam. No other soft tissue abnormality is  seen. IMPRESSION: Degenerative change without acute abnormality. Electronically Signed   By: Alcide CleverMark  Lukens M.D.   On: 07/22/2016 11:28   PROCEDURE NOTE: Left Knee Aspiration Risks of procedure were discussed with patient. Patient verbalized understanding, verbal consent obtained. PA-Whitney McVey performed the procedure under my direct supervision. Sterile prep with 3 iodine swabs. Superficial numbing was performed using ethyl chloride. A 20g needle was insert into the superior-lateral portion of knee. ~20cc of serosanguinous murky fluid was drained from left knee. Hemostats were used to secure needle. A total of 4cc mixture of lidocaine 1% with 40mg  Kenalog in ratio of 3:1 was subsequently injected into the knee. Cleansed, pressure dressing applied. Patient tolerated this procedure well.  Assessment and Plan :   1. Pain and swelling of left knee 2. Personal history of gout 3. Osteoarthritis of left knee, unspecified osteoarthritis type - Successful knee aspiration and injection performed. PA-Whitney did an excellent job performing this procedure. Patient reported significant relief. Labs are pending. Return to clinic if symptoms fail to resolve.  Wallis BambergMario Avnoor Koury, PA-C [facility] [phone] 07/22/2016 10:39 AM

## 2016-07-22 NOTE — Addendum Note (Signed)
Addended by: Wallis BambergMANI, Yaziel Brandon on: 07/22/2016 07:23 PM   Modules accepted: Orders

## 2016-07-22 NOTE — Patient Instructions (Addendum)
IF you received an x-ray today, you will receive an invoice from [facility]. Please contact [facility] at [phone] with questions or concerns regarding your invoice.   IF you received labwork today, you will receive an invoice from [facility]. Please contact [facility] at [phone] with questions or concerns regarding your invoice.   Our billing staff will not be able to assist you with questions regarding bills from these companies.  You will be contacted with the lab results as soon as they are available. The fastest way to get your results is to activate your My Chart account. Instructions are located on the last page of this paperwork. If you have not heard from us regarding the results in 2 weeks, please contact this office.     go Gout Gout is painful swelling that can occur in some of your joints. Gout is a type of arthritis. This condition is caused by having too much uric acid in your body. Uric acid is a chemical that forms when your body breaks down substances called purines. Purines are important for building body proteins. When your body has too much uric acid, sharp crystals can form and build up inside your joints. This causes pain and swelling. Gout attacks can happen quickly and be very painful (acute gout). Over time, the attacks can affect more joints and become more frequent (chronic gout). Gout can also cause uric acid to build up under your skin and inside your kidneys. What are the causes? This condition is caused by too much uric acid in your blood. This can occur because:  Your kidneys do not remove enough uric acid from your blood. This is the most common cause.  Your body makes too much uric acid. This can occur with some cancers and cancer treatments. It can also occur if your body is breaking down too many red blood cells (hemolytic anemia).  You eat too many foods that are high in purines. These foods  include organ meats and some seafood. Alcohol, especially beer, is also high in purines. A gout attack may be triggered by trauma or stress. What increases the risk? This condition is more likely to develop in people who:  Have a family history of gout.  Are male and middle-aged.  Are male and have gone through menopause.  Are obese.  Frequently drink alcohol, especially beer.  Are dehydrated.  Lose weight too quickly.  Have an organ transplant.  Have lead poisoning.  Take certain medicines, including aspirin, cyclosporine, diuretics, levodopa, and niacin.  Have kidney disease or psoriasis. What are the signs or symptoms? An attack of acute gout happens quickly. It usually occurs in just one joint. The most common place is the big toe. Attacks often start at night. Other joints that may be affected include joints of the feet, ankle, knee, fingers, wrist, or elbow. Symptoms may include:  Severe pain.  Warmth.  Swelling.  Stiffness.  Tenderness. The affected joint may be very painful to touch.  Shiny, red, or purple skin.  Chills and fever. Chronic gout may cause symptoms more frequently. More joints may be involved. You may also have white or yellow lumps (tophi) on your hands or feet or in other areas near your joints. How is this diagnosed? This condition is diagnosed based on your symptoms, medical history, and physical exam. You may have tests, such as:  Blood tests to measure uric acid levels.  Removal of joint fluid with a needle (aspiration)  to look for uric acid crystals.  X-rays to look for joint damage. How is this treated? Treatment for this condition has two phases: treating an acute attack and preventing future attacks. Acute gout treatment may include medicines to reduce pain and swelling, including:  NSAIDs.  Steroids. These are strong anti-inflammatory medicines that can be taken by mouth (orally) or injected into a joint.  Colchicine. This  medicine relieves pain and swelling when it is taken soon after an attack. It can be given orally or through an IV tube. Preventive treatment may include:  Daily use of smaller doses of NSAIDs or colchicine.  Use of a medicine that reduces uric acid levels in your blood.  Changes to your diet. You may need to see a specialist about healthy eating (dietitian). Follow these instructions at home: During a Gout Attack  If directed, apply ice to the affected area:  Put ice in a plastic bag.  Place a towel between your skin and the bag.  Leave the ice on for 20 minutes, 2-3 times a day.  Rest the joint as much as possible. If the affected joint is in your leg, you may be given crutches to use.  Raise (elevate) the affected joint above the level of your heart as often as possible.  Drink enough fluids to keep your urine clear or pale yellow.  Take over-the-counter and prescription medicines only as told by your health care provider.  Do not drive or operate heavy machinery while taking prescription pain medicine.  Follow instructions from your health care provider about eating or drinking restrictions.  Return to your normal activities as told by your health care provider. Ask your health care provider what activities are safe for you. Avoiding Future Gout Attacks  Follow a low-purine diet as told by your dietitian or health care provider. Avoid foods and drinks that are high in purines, including liver, kidney, anchovies, asparagus, herring, mushrooms, mussels, and beer.  Limit alcohol intake to no more than 1 drink a day for nonpregnant women and 2 drinks a day for men. One drink equals 12 oz of beer, 5 oz of wine, or 1 oz of hard liquor.  Maintain a healthy weight or lose weight if you are overweight. If you want to lose weight, talk with your health care provider. It is important that you do not lose weight too quickly.  Start or maintain an exercise program as told by your  health care provider.  Drink enough fluids to keep your urine clear or pale yellow.  Take over-the-counter and prescription medicines only as told by your health care provider.  Keep all follow-up visits as told by your health care provider. This is important. Contact a health care provider if:  You have another gout attack.  You continue to have symptoms of a gout attack after10 days of treatment.  You have side effects from your medicines.  You have chills or a fever.  You have burning pain when you urinate.  You have pain in your lower back or belly. Get help right away if:  You have severe or uncontrolled pain.  You cannot urinate. This information is not intended to replace advice given to you by your health care provider. Make sure you discuss any questions you have with your health care provider. Document Released: 08/16/2000 Document Revised: 01/25/2016 Document Reviewed: 06/01/2015 Elsevier Interactive Patient Education  2017 [employer]

## 2016-07-25 LAB — BODY FLUID CULTURE
Gram Stain: NONE SEEN
Organism ID, Bacteria: NO GROWTH

## 2016-07-26 ENCOUNTER — Encounter: Payer: Self-pay | Admitting: Urgent Care

## 2018-02-10 ENCOUNTER — Other Ambulatory Visit: Payer: Self-pay

## 2018-02-10 ENCOUNTER — Encounter (HOSPITAL_COMMUNITY): Payer: Self-pay

## 2018-02-10 ENCOUNTER — Emergency Department (HOSPITAL_COMMUNITY)
Admission: EM | Admit: 2018-02-10 | Discharge: 2018-02-10 | Disposition: A | Discharge status: Home / Self Care | Payer: Medicare Other | Attending: Emergency Medicine | Admitting: Emergency Medicine

## 2018-02-10 DIAGNOSIS — M25572 Pain in left ankle and joints of left foot: Secondary | ICD-10-CM | POA: Diagnosis present

## 2018-02-10 DIAGNOSIS — M10072 Idiopathic gout, left ankle and foot: Secondary | ICD-10-CM | POA: Diagnosis not present

## 2018-02-10 DIAGNOSIS — Z79899 Other long term (current) drug therapy: Secondary | ICD-10-CM | POA: Diagnosis not present

## 2018-02-10 DIAGNOSIS — M109 Gout, unspecified: Secondary | ICD-10-CM

## 2018-02-10 LAB — I-STAT CHEM 8, ED
BUN: 11 mg/dL (ref 6–20)
CHLORIDE: 97 mmol/L — AB (ref 101–111)
Calcium, Ion: 1.2 mmol/L (ref 1.15–1.40)
Creatinine, Ser: 1.2 mg/dL (ref 0.61–1.24)
Glucose, Bld: 100 mg/dL — ABNORMAL HIGH (ref 65–99)
HCT: 43 % (ref 39.0–52.0)
HEMOGLOBIN: 14.6 g/dL (ref 13.0–17.0)
POTASSIUM: 4.1 mmol/L (ref 3.5–5.1)
Sodium: 135 mmol/L (ref 135–145)
TCO2: 26 mmol/L (ref 22–32)

## 2018-02-10 MED ORDER — PREDNISONE 20 MG PO TABS
60.0000 mg | ORAL_TABLET | Freq: Once | ORAL | Status: AC
  Administered 2018-02-10: 60 mg via ORAL
  Filled 2018-02-10: qty 3

## 2018-02-10 MED ORDER — COLCHICINE 0.6 MG PO TABS: ORAL_TABLET | ORAL | 1 refills | Status: DC

## 2018-02-10 MED ORDER — HYDROCODONE-ACETAMINOPHEN 5-325 MG PO TABS
1.0000 | ORAL_TABLET | Freq: Once | ORAL | Status: DC
  Filled 2018-02-10: qty 1

## 2018-02-10 MED ORDER — PREDNISONE 20 MG PO TABS: ORAL_TABLET | ORAL | 0 refills | Status: DC

## 2018-02-10 MED ORDER — ALLOPURINOL 300 MG PO TABS: 300.0000 mg | ORAL_TABLET | Freq: Every day | ORAL | 5 refills | Status: DC

## 2018-02-10 MED ORDER — IBUPROFEN 200 MG PO TABS
600.0000 mg | ORAL_TABLET | Freq: Once | ORAL | Status: AC
  Administered 2018-02-10: 600 mg via ORAL
  Filled 2018-02-10: qty 3

## 2018-02-10 MED ORDER — HYDROCODONE-ACETAMINOPHEN 5-325 MG PO TABS: 1.0000 | ORAL_TABLET | Freq: Four times a day (QID) | ORAL | 0 refills | Status: DC | PRN

## 2018-02-10 NOTE — ED Provider Notes (Signed)
Emergency Department Provider Note   I have reviewed the triage vital signs and the nursing notes.   HISTORY  Chief Complaint Gout   HPI Cody Bautista is a 67 y.o. male with history of gout he does not have a primary care doctor the presents to the emergency department today with what he describes as a gout flare in his left ankle.  States that similar to previous episodes of gout.  Is been gone for a few days has not taken any for symptoms at home.  He is not on any medication to prevent gout.  No fevers, nausea, vomiting or trauma to the area. No other associated or modifying symptoms.    Past Medical History:  Diagnosis Date  . Arthritis    lower back with muscle spasms    Patient Active Problem List   Diagnosis Date Noted  . 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 BPH (benign prostatic hypertrophy) 02/22/2012  . Back pain, chronic 02/22/2012  . Gout 10/15/2011  . Elevated blood pressure 10/15/2011    Past Surgical History:  Procedure Laterality Date  . WRIST SURGERY  1980s   (RIGHT)   Accident at work- Location managermachine operator at [employer]    Current Outpatient Rx  . Order #: [id number] Class: Print  . Order #: [id number] Class: Print  . Order #: [id number] Class: Print  . Order #: [id number] Class: Print    Allergies Patient has no known allergies.  Family History  Problem Relation Age of Onset  . Kidney disease Mother        kidney stones  . Stroke Maternal Uncle 60  . Stroke Sister [id number]  . Hypertension Sister   . Stroke Brother [id number]  . Hypertension Brother   . Cirrhosis Brother     Social History Social History   Tobacco Use  . Smoking status: Never Smoker  . Smokeless tobacco: Never Used  Substance Use Topics  . Alcohol use: Yes    Alcohol/week: 2.5 - 3.0 oz    Types: 5 - 6 Standard drinks or equivalent per week  . Drug use: No    Review of Systems  All other systems negative except as documented in the HPI. All pertinent positives and negatives as  reviewed in the HPI. ____________________________________________   PHYSICAL EXAM:  VITAL SIGNS: ED Triage Vitals  Enc Vitals Group     BP 02/10/18 0924 (!) 168/100     Pulse Rate 02/10/18 0924 (!) 108     Resp 02/10/18 0924 16     Temp 02/10/18 0924 98.5 F (36.9 C)     Temp Source 02/10/18 0924 Oral     SpO2 02/10/18 0924 97 %     Weight 02/10/18 0925 195 lb (88.5 kg)     Height 02/10/18 0925 5\' 8"  (1.727 m)    Constitutional: Alert and oriented. Well appearing and in no acute distress. Eyes: Conjunctivae are normal. PERRL. EOMI. Head: Atraumatic. Nose: No congestion/rhinnorhea. Mouth/Throat: Mucous membranes are moist.  Oropharynx non-erythematous. Neck: No stridor.  No meningeal signs.   Cardiovascular: Normal rate, regular rhythm. Good peripheral circulation. Grossly normal heart sounds.   Respiratory: Normal respiratory effort.  No retractions. Lungs CTAB. Gastrointestinal: Soft and nontender. No distention.  Musculoskeletal: No lower extremity tenderness nor edema. No gross deformities of extremities. Erythema and ttp over left ankle. Pain with ROM Neurologic:  Normal speech and language. No gross focal neurologic deficits are appreciated.  Skin:  Skin is warm, dry and intact. No rash noted.   ____________________________________________  LABS (all labs ordered are listed, but only abnormal results are displayed)  Labs Reviewed  I-STAT CHEM 8, ED - Abnormal; Notable for the following components:      Result Value   Chloride 97 (*)    Glucose, Bld 100 (*)    All other components within normal limits   ________________________________________________   INITIAL IMPRESSION / ASSESSMENT AND PLAN / ED COURSE  Suspect gout flare. Doubt septic arthritis. Will check kidney function and start pain control as indicated.   Kidney function fine. Will dc w/ prednisone/colchicine/vicodin/allopurinol and phone number to establish priary care.      Pertinent labs &  imaging results that were available during my care of the patient were reviewed by me and considered in my medical decision making (see chart for details).  ____________________________________________  FINAL CLINICAL IMPRESSION(S) / ED DIAGNOSES  Final diagnoses:  Acute gout of left ankle, unspecified cause     MEDICATIONS GIVEN DURING THIS VISIT:  Medications  HYDROcodone-acetaminophen (NORCO/VICODIN) 5-325 MG per tablet 1 tablet (1 tablet Oral Refused 02/10/18 1235)  ibuprofen (ADVIL,MOTRIN) tablet 600 mg (600 mg Oral Given 02/10/18 1240)  predniSONE (DELTASONE) tablet 60 mg (60 mg Oral Given 02/10/18 1444)     NEW OUTPATIENT MEDICATIONS STARTED DURING THIS VISIT:  Discharge Medication List as of 02/10/2018  2:33 PM    START taking these medications   Details  HYDROcodone-acetaminophen (NORCO/VICODIN) 5-325 MG tablet Take 1-2 tablets by mouth every 6 (six) hours as needed for severe pain., Starting Tue 02/10/2018, Print    predniSONE (DELTASONE) 20 MG tablet 3 tabs po daily x 3 days, then 2 tabs x 3 days, then 1.5 tabs x 3 days, then 1 tab x 3 days, then 0.5 tabs x 3 days, Print        Note:  This note was prepared with assistance of Dragon voice recognition software. Occasional wrong-word or sound-a-like substitutions may have occurred due to the inherent limitations of voice recognition software.   Marily Memos, MD 02/10/18 1650

## 2018-02-10 NOTE — ED Notes (Signed)
ED Provider at bedside. 

## 2018-02-10 NOTE — ED Triage Notes (Signed)
Pt states that his "gout is flaring up". Pt states that he ate some red meat "and shouldn't have done that". Pt states that he doesn't take medication as he controls his gout via diet.

## 2018-07-01 IMAGING — DX DG KNEE COMPLETE 4+V*L*
4 series · 4 of 4 positions shown · non-contrast
Comparison: 04/10/2016

CLINICAL DATA: Three day history of knee pain and swelling, initial
encounter

EXAM:
LEFT KNEE - COMPLETE 4+ VIEW

[knee ap]
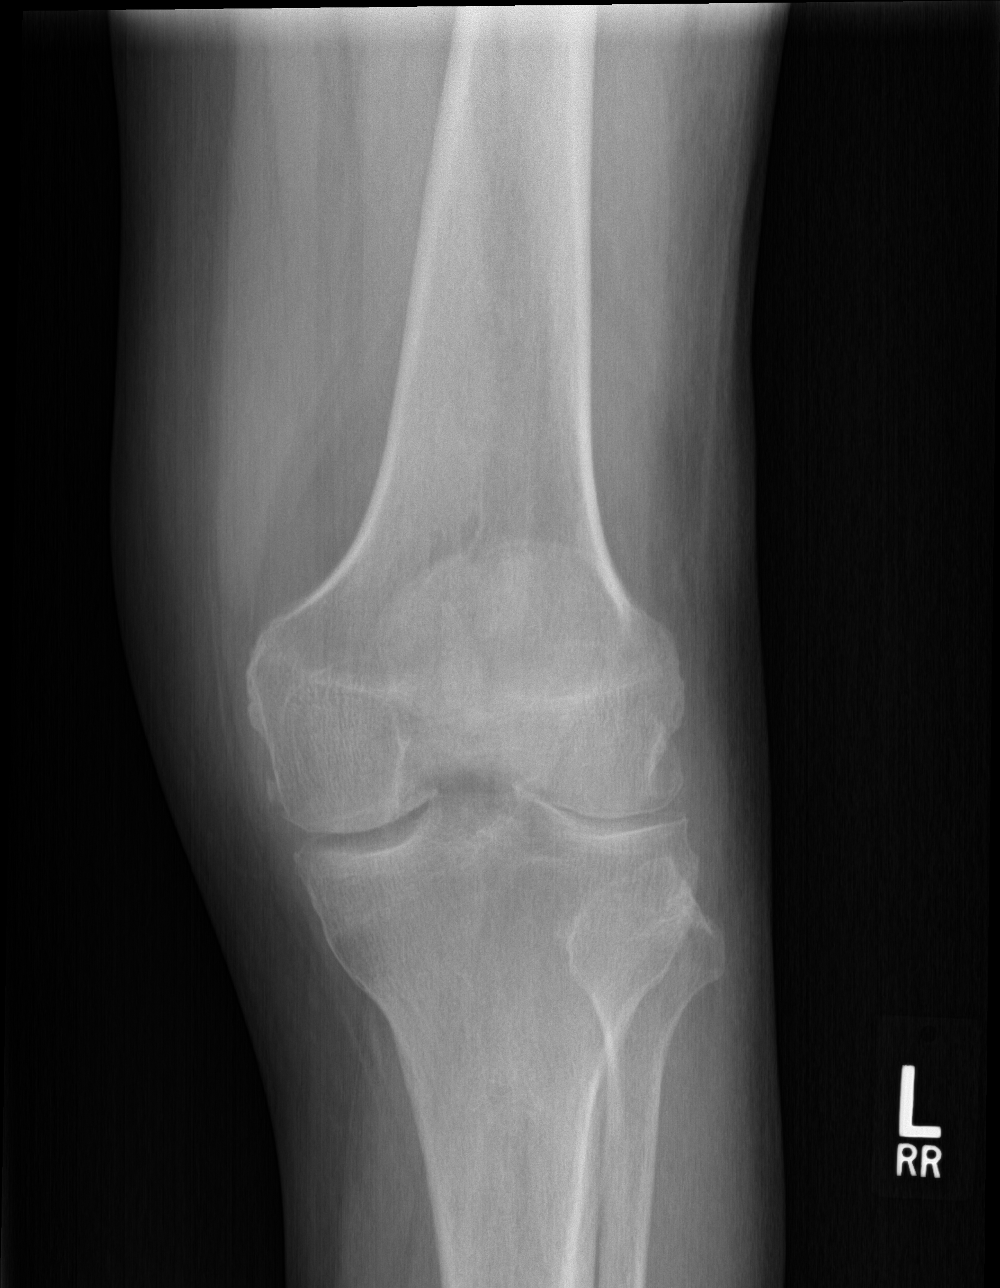

[knee obl (1 of 2)]
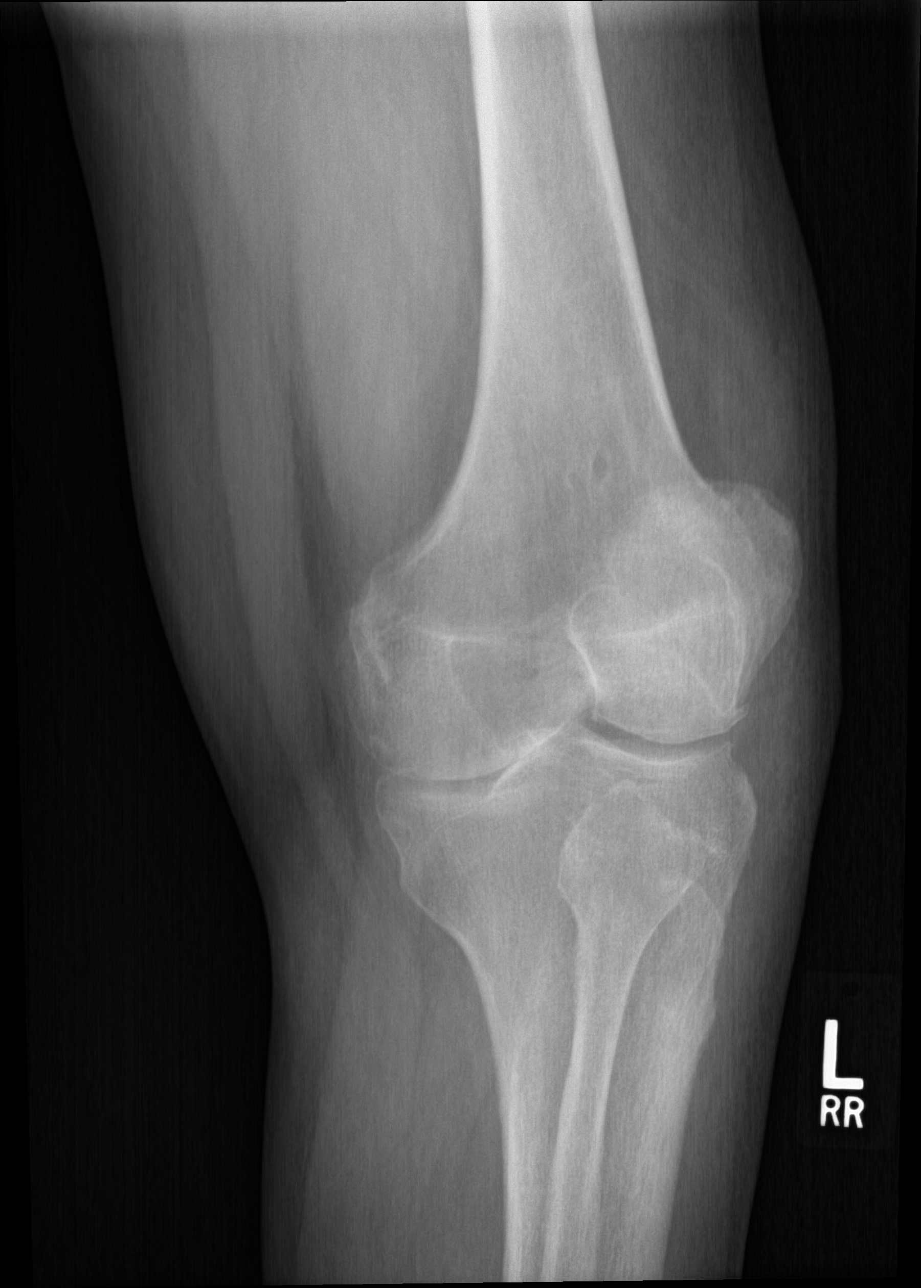

[knee obl (2 of 2)]
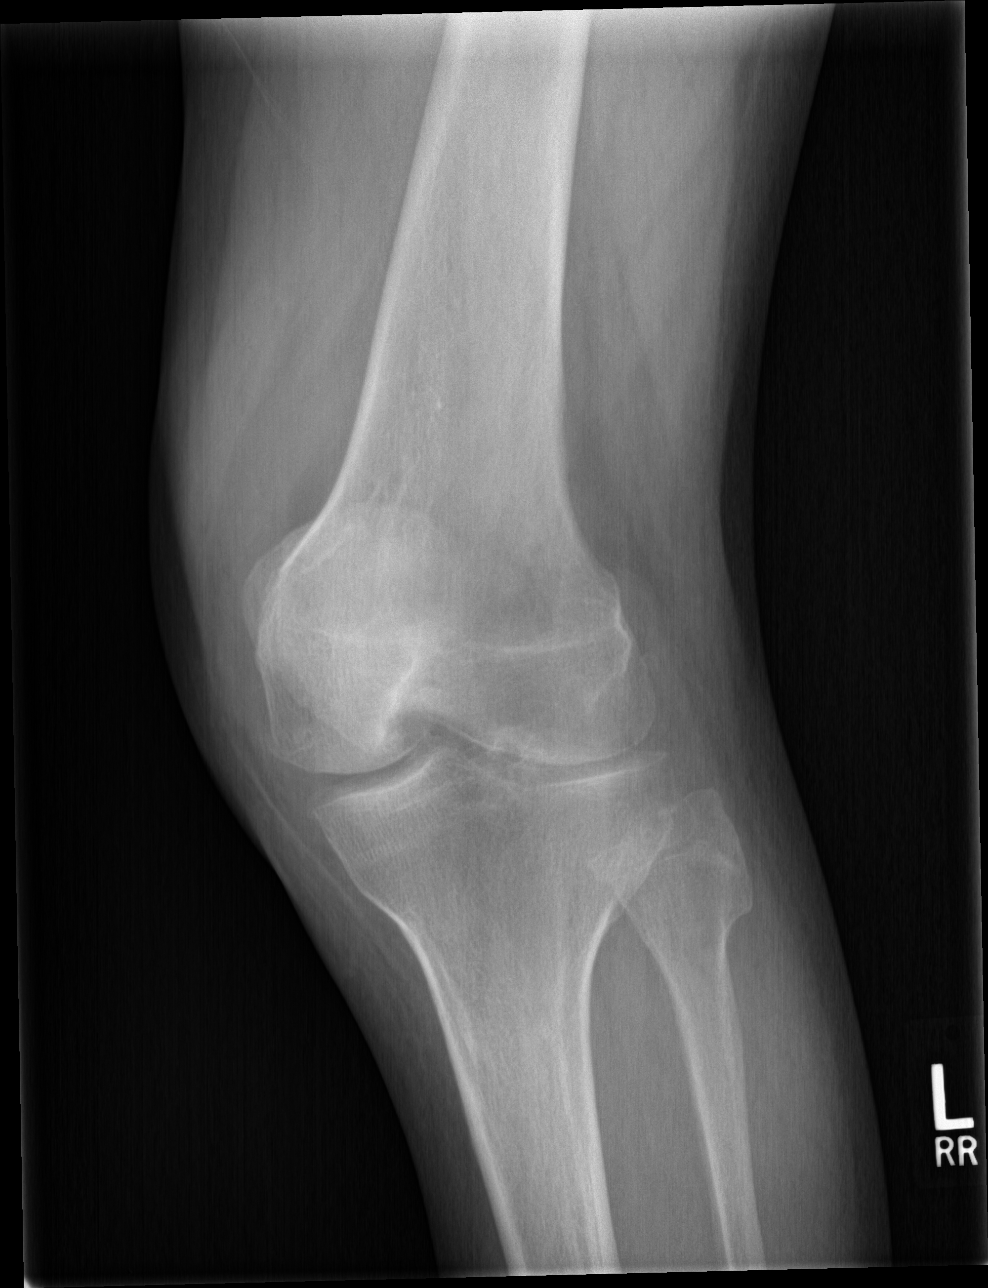

[knee lat]
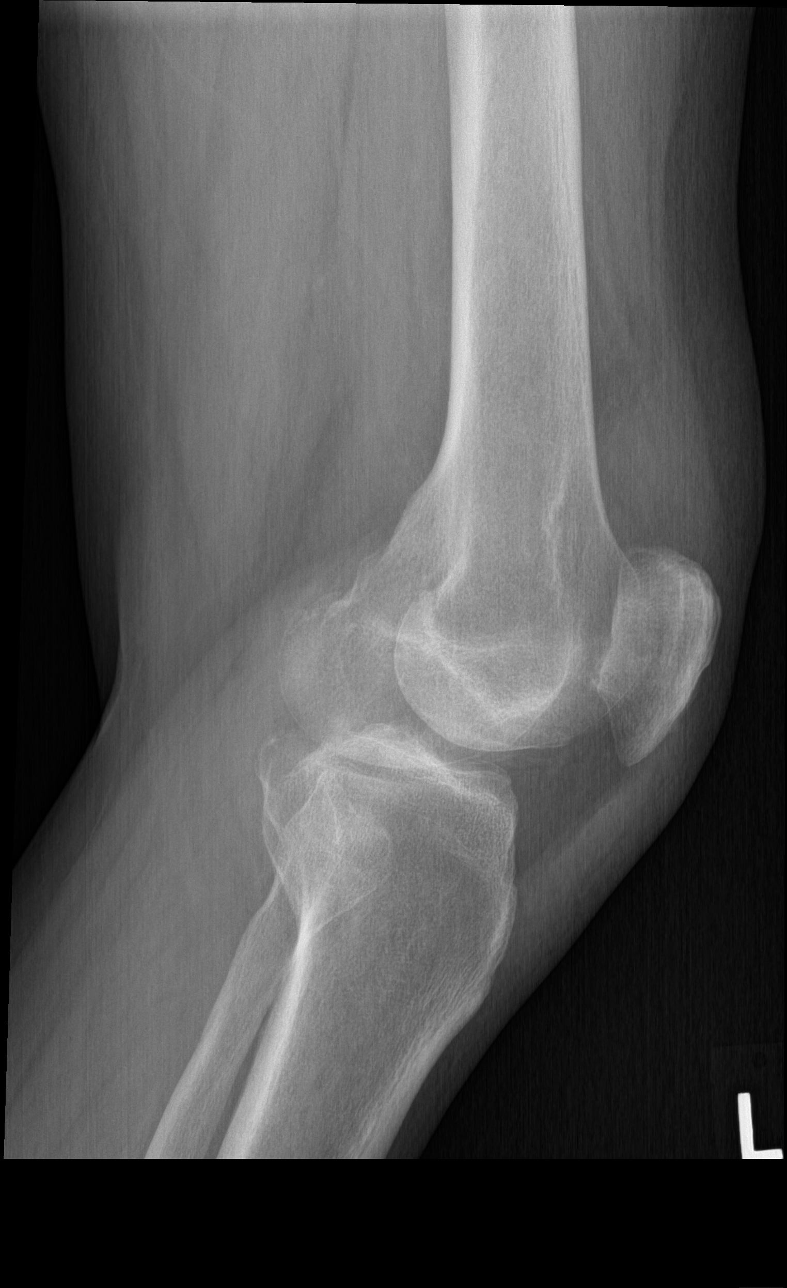

[4 of 4 positions shown; findings below may reference images not displayed]

FINDINGS: No acute fracture or dislocation is noted. Minimal joint effusion is
noted. Tricompartmental degenerative changes are seen similar to
that noted on the prior exam. No other soft tissue abnormality is
seen.
IMPRESSION: Degenerative change without acute abnormality.

## 2018-07-27 ENCOUNTER — Encounter (HOSPITAL_COMMUNITY): Payer: Self-pay

## 2018-07-27 ENCOUNTER — Emergency Department (HOSPITAL_COMMUNITY)
Admission: EM | Admit: 2018-07-27 | Discharge: 2018-07-27 | Disposition: A | Discharge status: Home / Self Care | Payer: Medicare Other | Attending: Emergency Medicine | Admitting: Emergency Medicine

## 2018-07-27 ENCOUNTER — Other Ambulatory Visit: Payer: Self-pay

## 2018-07-27 DIAGNOSIS — M25561 Pain in right knee: Secondary | ICD-10-CM | POA: Insufficient documentation

## 2018-07-27 HISTORY — DX: Gout, unspecified: M10.9

## 2018-07-27 HISTORY — DX: Essential (primary) hypertension: I10

## 2018-07-27 MED ORDER — ALLOPURINOL 300 MG PO TABS: 300.0000 mg | ORAL_TABLET | Freq: Every day | ORAL | 5 refills | Status: AC

## 2018-07-27 MED ORDER — PREDNISONE 20 MG PO TABS
60.0000 mg | ORAL_TABLET | Freq: Once | ORAL | Status: AC
  Administered 2018-07-27: 60 mg via ORAL
  Filled 2018-07-27: qty 3

## 2018-07-27 MED ORDER — HYDROCODONE-ACETAMINOPHEN 5-325 MG PO TABS
1.0000 | ORAL_TABLET | Freq: Once | ORAL | Status: AC
  Administered 2018-07-27: 1 via ORAL
  Filled 2018-07-27: qty 1

## 2018-07-27 MED ORDER — HYDROCODONE-ACETAMINOPHEN 5-325 MG PO TABS: 1.0000 | ORAL_TABLET | Freq: Four times a day (QID) | ORAL | 0 refills | Status: DC | PRN

## 2018-07-27 MED ORDER — PREDNISONE 20 MG PO TABS: ORAL_TABLET | ORAL | 0 refills | Status: DC

## 2018-07-27 NOTE — ED Provider Notes (Signed)
Emergency Department Provider Note   I have reviewed the triage vital signs and the nursing notes.   HISTORY  Chief Complaint Joint Swelling; Bautista swelling; and Arm Swelling   HPI Cody Bautista is a 67 y.o. male with history of arthritis, gout hypertension who presents the emergency department today with a gout flare of his right knee.  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  States that in his right knee and feels just like previous gout flares.  Has some in his left wrist as well which is also similar.  Has any fevers, redness to the areas.  No injuries to the areas.  Does not have a primary doctor as his doctor apparently does not work in the office anymore. No other associated or modifying symptoms.    Past Medical History:  Diagnosis Date  . Arthritis    lower back with muscle spasms  . Gout   . Hypertension     Patient Active Problem List   Diagnosis Date Noted  . 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 BPH (benign prostatic hypertrophy) 02/22/2012  . Back pain, chronic 02/22/2012  . Gout 10/15/2011  . Elevated blood pressure 10/15/2011    Past Surgical History:  Procedure Laterality Date  . WRIST SURGERY  1980s   (RIGHT)   Accident at work- Location managermachine operator at [employer]    Current Outpatient Rx  . Order #: [id number] Class: Print  . Order #: [id number] Class: Print  . Order #: [id number] Class: Print    Allergies Patient has no known allergies.  Family History  Problem Relation Age of Onset  . Kidney disease Mother        kidney stones  . Stroke Maternal Uncle 60  . Stroke Sister 2762  . Hypertension Sister   . Stroke Brother 2359  . Hypertension Brother   . Cirrhosis Brother     Social History Social History   Tobacco Use  . Smoking status: Never Smoker  . Smokeless tobacco: Never Used  Substance Use Topics  . Alcohol use: Yes   Alcohol/week: 5.0 - 6.0 standard drinks    Types: 5 - 6 Standard drinks or equivalent per week  . Drug use: No    Review of Systems  All other systems negative except as documented in the HPI. All pertinent positives and negatives as reviewed in the HPI. ____________________________________________   PHYSICAL EXAM:  VITAL SIGNS: ED Triage Vitals  Enc Vitals Group     BP 07/27/18 0935 (!) 172/108     Pulse Rate 07/27/18 0935 (!) 103     Resp 07/27/18 0935 20     Temp 07/27/18 0935 97.6 F (36.4 C)     Temp Source 07/27/18 0935 Oral     SpO2 07/27/18 0935 100 %     Weight 07/27/18 0937 200 lb (90.7 kg)     Height 07/27/18 0937 5\' 8"  (1.727 m)    Constitutional: Alert and oriented. Well appearing and in no acute distress. Eyes: Conjunctivae are normal. PERRL. EOMI. Head: Atraumatic. Nose: No congestion/rhinnorhea. Mouth/Throat: Mucous membranes are moist.  Oropharynx non-erythematous. Neck: No stridor.  No meningeal signs.   Cardiovascular: Normal rate, regular rhythm. Good peripheral circulation. Grossly normal heart sounds.   Respiratory: Normal respiratory effort.  No retractions. Lungs CTAB. Gastrointestinal: Soft and nontender. No distention.  Musculoskeletal: Right knee swelling and warmth. Mild ttp with ROM  but able to range it actively. Left wrist mildly swollen without warmth or erythema. Good ROM.  Neurologic:  Normal speech and language. No gross focal neurologic deficits are appreciated.  Skin:  Skin is warm, dry and intact. No rash noted. ____________________________________________   INITIAL IMPRESSION / ASSESSMENT AND PLAN / ED COURSE  Low suspicion for infectious cause of his symptoms at this time.  Suspect this is more likely related to gout.  He has not followed up with a primary doctor since last time I saw him emergency room and needs to do so for his blood pressure as well.  No evidence of acute hypertensive crisis at this point     Pertinent labs &  imaging results that were available during my care of the patient were reviewed by me and considered in my medical decision making (see chart for details).  ____________________________________________  FINAL CLINICAL IMPRESSION(S) / ED DIAGNOSES  Final diagnoses:  Acute pain of right knee     MEDICATIONS GIVEN DURING THIS VISIT:  Medications  predniSONE (DELTASONE) tablet 60 mg (60 mg Oral Given 07/27/18 1030)  HYDROcodone-acetaminophen (NORCO/VICODIN) 5-325 MG per tablet 1 tablet (1 tablet Oral Given 07/27/18 1030)     NEW OUTPATIENT MEDICATIONS STARTED DURING THIS VISIT:  Discharge Medication List as of 07/27/2018 10:22 AM      Note:  This note was prepared with assistance of Dragon voice recognition software. Occasional wrong-word or sound-a-like substitutions may have occurred due to the inherent limitations of voice recognition software.   Marily Memos, MD 07/27/18 1536

## 2018-07-27 NOTE — ED Triage Notes (Addendum)
Patient c/o right knee swelling and left hand and arm swelling x 4 days. 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

## 2018-12-23 ENCOUNTER — Emergency Department (HOSPITAL_COMMUNITY)
Admission: EM | Admit: 2018-12-23 | Discharge: 2018-12-23 | Disposition: A | Discharge status: Home / Self Care | Payer: Medicare Other | Attending: Emergency Medicine | Admitting: Emergency Medicine

## 2018-12-23 ENCOUNTER — Encounter (HOSPITAL_COMMUNITY): Payer: Self-pay

## 2018-12-23 DIAGNOSIS — M25562 Pain in left knee: Secondary | ICD-10-CM | POA: Diagnosis not present

## 2018-12-23 DIAGNOSIS — R609 Edema, unspecified: Secondary | ICD-10-CM | POA: Diagnosis not present

## 2018-12-23 DIAGNOSIS — I10 Essential (primary) hypertension: Secondary | ICD-10-CM | POA: Diagnosis not present

## 2018-12-23 DIAGNOSIS — M109 Gout, unspecified: Secondary | ICD-10-CM | POA: Diagnosis not present

## 2018-12-23 DIAGNOSIS — M25561 Pain in right knee: Secondary | ICD-10-CM | POA: Diagnosis present

## 2018-12-23 MED ORDER — PREDNISONE 10 MG (21) PO TBPK: ORAL_TABLET | ORAL | 0 refills | Status: DC

## 2018-12-23 MED ORDER — IBUPROFEN 200 MG PO TABS
600.0000 mg | ORAL_TABLET | Freq: Once | ORAL | Status: AC
  Administered 2018-12-23: 600 mg via ORAL
  Filled 2018-12-23: qty 3

## 2018-12-23 MED ORDER — PREDNISONE 20 MG PO TABS
60.0000 mg | ORAL_TABLET | Freq: Once | ORAL | Status: AC
  Administered 2018-12-23: 60 mg via ORAL
  Filled 2018-12-23: qty 3

## 2018-12-23 MED ORDER — HYDROCODONE-ACETAMINOPHEN 5-325 MG PO TABS: 2.0000 | ORAL_TABLET | Freq: Three times a day (TID) | ORAL | 0 refills | Status: DC | PRN

## 2018-12-23 NOTE — ED Notes (Signed)
PA at bedside.

## 2018-12-23 NOTE — ED Triage Notes (Addendum)
Patient arrived via [facility] from home. Patient c/o bilateral knee and bilateral elbow pain X4 days.   Denies fever or being around anyone sick.  Denies Falls or injuries.   A/Ox4

## 2018-12-23 NOTE — Discharge Instructions (Addendum)
Take prednisone as prescribed. Prescription given for Norco. Take medication as directed and do not operate machinery, drive a car, or work while taking this medication as it can make you drowsy.   Please follow up with your primary care provider within 5-7 days for re-evaluation of your symptoms. If you do not have a primary care provider, information for a healthcare clinic has been provided for you to make arrangements for follow up care. Please return to the emergency department for any new or worsening symptoms including fevers, redness to your joints, increased pain to your joints, or increased swelling to your joints. Return to the ED if your symptoms persist or worsen over the next 24-48 hours.

## 2018-12-23 NOTE — ED Notes (Signed)
Pt informed of blood pressure, and agrees to follow-up with [facility] for blood pressure regulation.

## 2018-12-23 NOTE — ED Notes (Signed)
Bed: MA26 Expected date:  Expected time:  Means of arrival:  Comments: EMS/Gout

## 2018-12-23 NOTE — ED Provider Notes (Signed)
[facility]-EMERGENCY DEPT Provider Note   CSN: [id number] Arrival date & time: 12/23/18  1194    History   Chief Complaint Chief Complaint  Patient presents with  . Gout    HPI Cody Bautista is a 68 y.o. male.     HPI   Pt is a 68 y/o male with a long h/o gout who presents to the ED today c/o acute gout flare. States he has pain to his bilat knees (L>R) and bilat elbow for the last 4 days. Pain is constant and has worsened since onset. Pain rated 8.5/10. States pain is similar to his past gout flares. He denies fevers, sweats, chills, body aches. No NV. No chest pain or sob. No HA. States he has been able to ambulate at home without difficulty. No recent falls/trauma. Has been taking allopurinol. No recent etoh use.  Past Medical History:  Diagnosis Date  . Arthritis    lower back with muscle spasms  . Gout   . Hypertension     Patient Active Problem List   Diagnosis Date Noted  . 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 BPH (benign prostatic hypertrophy) 02/22/2012  . Back pain, chronic 02/22/2012  . Gout 10/15/2011  . Elevated blood pressure 10/15/2011    Past Surgical History:  Procedure Laterality Date  . WRIST SURGERY  1980s   (RIGHT)   Accident at work- Location manager at [employer] Medications    Prior to Admission medications   Medication Sig Start Date End Date Taking? Authorizing Provider  allopurinol (ZYLOPRIM) 300 MG tablet Take 1 tablet (300 mg total) by mouth daily. 07/27/18   Mesner, Barbara Cower, MD  HYDROcodone-acetaminophen (NORCO/VICODIN) 5-325 MG tablet Take 2 tablets by mouth every 8 (eight) hours as needed. 12/23/18   Nada Godley S, PA-C  predniSONE (STERAPRED UNI-PAK 21 TAB) 10 MG (21) TBPK tablet Take 6 tabs by mouth daily  for 2 days, then 5 tabs for 2 days, then 4 tabs for 2 days, then 3 tabs for 2 days, 2 tabs for 2 days, then 1 tab by mouth daily for 2 days 12/23/18   Amra Shukla S, PA-C    Family History  Family History  Problem Relation Age of Onset  . Kidney disease Mother        kidney stones  . Stroke Maternal Uncle 60  . Stroke Sister 71  . Hypertension Sister   . Stroke Brother 35  . Hypertension Brother   . Cirrhosis Brother     Social History Social History   Tobacco Use  . Smoking status: Never Smoker  . Smokeless tobacco: Never Used  Substance Use Topics  . Alcohol use: Yes    Alcohol/week: 5.0 - 6.0 standard drinks    Types: 5 - 6 Standard drinks or equivalent per week  . Drug use: No     Allergies   Patient has no known allergies.   Review of Systems Review of Systems  Constitutional: Negative for chills, diaphoresis and fever.  Respiratory: Negative for shortness of breath.   Cardiovascular: Negative for chest pain.  Gastrointestinal: Negative for nausea and vomiting.  Musculoskeletal: Negative for myalgias.       Bilat knee pain, bilat elbow pain  Skin: Negative for color change and rash.  Neurological: Negative for headaches.     Physical Exam Updated Vital Signs BP (!) 218/115   Pulse 91   Temp 98.4 F (36.9 C) (Oral)   Resp  18   SpO2 100%   Physical Exam Vitals signs and nursing note reviewed.  Constitutional:      General: He is not in acute distress.    Appearance: He is well-developed. He is not ill-appearing or toxic-appearing.  HENT:     Head: Normocephalic and atraumatic.  Eyes:     Conjunctiva/sclera: Conjunctivae normal.  Neck:     Musculoskeletal: Neck supple.  Cardiovascular:     Rate and Rhythm: Normal rate and regular rhythm.     Heart sounds: No murmur.  Pulmonary:     Effort: Pulmonary effort is normal. No respiratory distress.     Breath sounds: Normal breath sounds.  Abdominal:     General: Bowel sounds are normal.     Palpations: Abdomen is soft.     Tenderness: There is no abdominal tenderness.  Musculoskeletal:     Comments: Diffuse TTP to bilat knees. There is minimal warmth to bilat knees, but no erythema  or evidence of effusion bilat. He has mildly decreased ROM to bilat knees but is able to flex/extend somewhat. R elbow with mild amount of swelling to right olecranon bursae without significant tenderness and without erythema. No obvious TTP to the left elbow. No erythema or swelling to left elbow. FROM of the bilat elbows.   Skin:    General: Skin is warm and dry.  Neurological:     Mental Status: He is alert.    ED Treatments / Results  Labs (all labs ordered are listed, but only abnormal results are displayed) Labs Reviewed - No data to display  EKG None  Radiology No results found.  Procedures Procedures (including critical care time)  Medications Ordered in ED Medications  predniSONE (DELTASONE) tablet 60 mg (60 mg Oral Given 12/23/18 0839)  ibuprofen (ADVIL) tablet 600 mg (600 mg Oral Given 12/23/18 0839)     Initial Impression / Assessment and Plan / ED Course  I have reviewed the triage vital signs and the nursing notes.  Pertinent labs & imaging results that were available during my care of the patient were reviewed by me and considered in my medical decision making (see chart for details).     Final Clinical Impressions(s) / ED Diagnoses   Final diagnoses:  Acute gout, unspecified cause, unspecified site   Pt with a long h/o gout presenting with bilat knee pain (L>R) and bilat elbow pain. He states that sxs. Has some swelling to bilat knees and minimal warmth, however no erythema. Has swelling to R olecranon bursae without significant TTP and no signs of infection. L elbow is w/o TTP. FROM of bilat elbows. Somewhat decreased ROM to bilat knees due to pain. Doubt occult fx or injury as pt has had no known falls. No h/o PUD. Last renal fxn 6 months ago was wnl. Afebrile here, no tachycardia. BP elevated. Has known h/o this. No s/s to suggest HTN emergency. He will need to f/u. I also do not suspect septic joint in this patient. Will give rx for prednisone taper and pain  meds. Advised on f/u and advised to turn to ED if no improvement or worsening sxs in 24-48 hours. Other return precautions discussed. He voices understanding and is in agreement with plan. All questions answered. Pt stable for discharge.   ED Discharge Orders         Ordered    predniSONE (STERAPRED UNI-PAK 21 TAB) 10 MG (21) TBPK tablet     12/23/18 0808    HYDROcodone-acetaminophen (NORCO/VICODIN) 5-325  MG tablet  Every 8 hours PRN     12/23/18 0808           Karrie MeresCouture, Ralf Konopka S, PA-C 12/23/18 [id number]    Lorre NickAllen, Anthony, MD 12/28/18 1259

## 2023-01-09 ENCOUNTER — Other Ambulatory Visit: Payer: Self-pay

## 2023-01-09 ENCOUNTER — Emergency Department (HOSPITAL_COMMUNITY): Payer: Medicare PPO

## 2023-01-09 ENCOUNTER — Emergency Department (HOSPITAL_COMMUNITY)
Admission: EM | Admit: 2023-01-09 | Discharge: 2023-01-09 | Disposition: A | Discharge status: Home / Self Care | Payer: Medicare PPO | Attending: Emergency Medicine | Admitting: Emergency Medicine

## 2023-01-09 DIAGNOSIS — I10 Essential (primary) hypertension: Secondary | ICD-10-CM | POA: Diagnosis not present

## 2023-01-09 DIAGNOSIS — M25462 Effusion, left knee: Secondary | ICD-10-CM | POA: Insufficient documentation

## 2023-01-09 DIAGNOSIS — M7989 Other specified soft tissue disorders: Secondary | ICD-10-CM | POA: Diagnosis present

## 2023-01-09 MED ORDER — HYDROCODONE-ACETAMINOPHEN 5-325 MG PO TABS
1.0000 | ORAL_TABLET | Freq: Once | ORAL | Status: AC
  Administered 2023-01-09: 1 via ORAL
  Filled 2023-01-09: qty 1

## 2023-01-09 MED ORDER — HYDROCODONE-ACETAMINOPHEN 5-325 MG PO TABS: 1.0000 | ORAL_TABLET | Freq: Four times a day (QID) | ORAL | 0 refills | Status: DC | PRN

## 2023-01-09 MED ORDER — LIDOCAINE-EPINEPHRINE (PF) 2 %-1:200000 IJ SOLN
10.0000 mL | Freq: Once | INTRAMUSCULAR | Status: AC
  Administered 2023-01-09: 10 mL
  Filled 2023-01-09: qty 20

## 2023-01-09 NOTE — Progress Notes (Signed)
TOC consulted for PCP needs. Attempted to call [facility] to schedule PCP appt but all are closed for lunch hour. [facility] was added to pt's AVS for pt to call and schedule a follow up appt. No further TOC needs.

## 2023-01-09 NOTE — ED Provider Notes (Signed)
[place] EMERGENCY DEPARTMENT AT [facility] Provider Note   CSN: [id number] Arrival date & time: 01/09/23  1027     History  Chief Complaint  Patient presents with   Knee Pain    Cody Bautista is a 72 y.o. male.  Patient is a 72 year old male with a history of arthritis, hypertension and gout who is presenting today with complaints of pain and swelling in his left knee for the last 3 to 4 days.  He reports that over-the-counter medications are not helping with the pain and now its become so bad it is difficult for him to walk.  He denies any falls, fevers or redness.  He does not take any anticoagulation.  The history is provided by the patient.  Knee Pain      Home Medications Prior to Admission medications   Medication Sig Start Date End Date Taking? Authorizing Provider  HYDROcodone-acetaminophen (NORCO/VICODIN) 5-325 MG tablet Take 1 tablet by mouth every 6 (six) hours as needed. 01/09/23  Yes Gwyneth Sprout, MD  allopurinol (ZYLOPRIM) 300 MG tablet Take 1 tablet (300 mg total) by mouth daily. 07/27/18   Mesner, Barbara Cower, MD  predniSONE (STERAPRED UNI-PAK 21 TAB) 10 MG (21) TBPK tablet Take 6 tabs by mouth daily  for 2 days, then 5 tabs for 2 days, then 4 tabs for 2 days, then 3 tabs for 2 days, 2 tabs for 2 days, then 1 tab by mouth daily for 2 days 12/23/18   Couture, Cortni S, PA-C      Allergies    Patient has no known allergies.    Review of Systems   Review of Systems  Physical Exam Updated Vital Signs BP (!) 164/89   Pulse 80   Temp 99.8 F (37.7 C) (Oral)   Resp 18   Ht 5\' 8"  (1.727 m)   Wt 90 kg   SpO2 95%   BMI 30.17 kg/m  Physical Exam Vitals and nursing note reviewed.  Constitutional:      General: He is not in acute distress.    Appearance: Normal appearance. He is normal weight.  Cardiovascular:     Rate and Rhythm: Normal rate.  Pulmonary:     Effort: Pulmonary effort is normal.  Musculoskeletal:        General: Tenderness  present.     Left knee: Swelling and effusion present. No ecchymosis. Normal range of motion. Tenderness present.     Comments: Tenderness with palpation over the knee diffusely with suprapatellar effusion noted.  No erythema noted and patient is able to range the knee  Skin:    General: Skin is warm and dry.  Neurological:     Mental Status: He is alert and oriented to person, place, and time. Mental status is at baseline.     ED Results / Procedures / Treatments   Labs (all labs ordered are listed, but only abnormal results are displayed) Labs Reviewed - No data to display  EKG None  Radiology DG Knee Complete 4 Views Left  Result Date: 01/09/2023 CLINICAL DATA:  Left knee pain and swelling for 3 days. EXAM: LEFT KNEE - COMPLETE 4+ VIEW COMPARISON:  07/22/2016 FINDINGS: Moderate tricompartmental degenerative changes with joint space narrowing and spurring. No acute bony abnormality, osteochondral lesion or chondrocalcinosis. There is a moderate to large joint effusion noted. IMPRESSION: 1. Moderate tricompartmental degenerative changes and moderate to large joint effusion. 2. No acute bony findings. Electronically Signed   By: Orlene Plum.D.  On: 01/09/2023 11:28    Procedures .Joint Aspiration/Arthrocentesis  Date/Time: 01/09/2023 12:12 PM  Performed by: Gwyneth Sprout, MD Authorized by: Gwyneth Sprout, MD   Consent:    Consent obtained:  Verbal   Consent given by:  Patient   Risks, benefits, and alternatives were discussed: yes     Risks discussed:  Bleeding, infection and pain   Alternatives discussed:  No treatment Universal protocol:    Procedure explained and questions answered to patient or proxy's satisfaction: yes     Relevant documents present and verified: yes     Imaging studies available: yes     Immediately prior to procedure, a time out was called: yes     Patient identity confirmed:  Verbally with patient Location:    Location:  Knee   Knee:  L  knee Anesthesia:    Anesthesia method:  Local infiltration   Local anesthetic:  Lidocaine 2% WITH epi Procedure details:    Preparation: Patient was prepped and draped in usual sterile fashion     Needle gauge:  18 G   Ultrasound guidance: no     Approach:  Superior   Aspirate amount:  50mL   Aspirate characteristics:  Clear and yellow   Steroid injected: no     Specimen collected: no   Post-procedure details:    Dressing:  Adhesive bandage   Procedure completion:  Tolerated     Medications Ordered in ED Medications  HYDROcodone-acetaminophen (NORCO/VICODIN) 5-325 MG per tablet 1 tablet (1 tablet Oral Given 01/09/23 1118)  lidocaine-EPINEPHrine (XYLOCAINE W/EPI) 2 %-1:200000 (PF) injection 10 mL (10 mLs Infiltration Given 01/09/23 1201)    ED Course/ Medical Decision Making/ A&P                             Medical Decision Making Amount and/or Complexity of Data Reviewed Radiology: ordered.  Risk Prescription drug management.   Patient presenting today with prior history of gout with multiple ER visits in the past but been about 4 years.  He has pain and swelling to the left knee today without trauma.  Suspect recurrent gout flare or effusion from arthritis.  There is no evidence to suggest septic knee as there is no erythema patient has been afebrile and he is able to range the knee.  I have independently visualized and interpreted pt's images today.  X-ray today shows arthritis in the effusion.  Discussed this with the patient.  Gave him an option of supportive care and follow-up with the Ortho versus that in addition to joint aspiration.  Patient desires to have joint aspiration.  50 mL of clear yellow fluid was removed.  This was not sent for culture as there were no crystals visualized and low suspicion for any infectious etiology.  This was done for comfort.  Patient will be discharged home and given follow-up.  He is comfortable with this plan.  Also discussed with patient  that his blood pressure has been elevated here.  He has never been treated for high blood pressure reports he does not have a doctor at this time but encouraged him to follow-up with a doctor and have blood pressure recheck and that he may need to start blood pressure medication in the future.  At this time he is not having any symptoms consistent with hypertensive emergency.         Final Clinical Impression(s) / ED Diagnoses Final diagnoses:  Uncontrolled hypertension  Effusion of  left knee    Rx / DC Orders ED Discharge Orders          Ordered    HYDROcodone-acetaminophen (NORCO/VICODIN) 5-325 MG tablet  Every 6 hours PRN        01/09/23 1215              Gwyneth Sprout, MD 01/09/23 1218

## 2023-01-09 NOTE — Discharge Instructions (Signed)
Fluid was removed from your knee today.  Make sure you keep the bandage on firmly for the next few days to prevent fluid from coming back.  You were given a prescription for some pain medicine but if Tylenol takes care of the pain you do not have to use the stronger medication.  Follow-up with the orthopedist so that they can see if you need injections in your knee or a knee replacement.  Also while you are here your blood pressure was elevated.  It will be important to follow-up with your regular doctor to make sure that it was not just related to pain to make sure you do not need to be on blood pressure medication in the future.

## 2023-01-09 NOTE — ED Triage Notes (Signed)
Left knee pain and swelling x 3 days. Denies injury or trauma

## 2024-01-08 ENCOUNTER — Inpatient Hospital Stay (HOSPITAL_COMMUNITY)
Admission: EM | Admit: 2024-01-08 | Discharge: 2024-01-13 | DRG: 065 | Disposition: A | Discharge status: Skilled Nursing Facility | Attending: Internal Medicine | Admitting: Internal Medicine

## 2024-01-08 ENCOUNTER — Emergency Department (HOSPITAL_COMMUNITY)

## 2024-01-08 ENCOUNTER — Other Ambulatory Visit: Payer: Self-pay

## 2024-01-08 ENCOUNTER — Encounter (HOSPITAL_COMMUNITY): Payer: Self-pay | Admitting: Emergency Medicine

## 2024-01-08 DIAGNOSIS — M109 Gout, unspecified: Secondary | ICD-10-CM | POA: Diagnosis present

## 2024-01-08 DIAGNOSIS — E785 Hyperlipidemia, unspecified: Secondary | ICD-10-CM | POA: Diagnosis present

## 2024-01-08 DIAGNOSIS — E66811 Obesity, class 1: Secondary | ICD-10-CM | POA: Diagnosis present

## 2024-01-08 DIAGNOSIS — Z841 Family history of disorders of kidney and ureter: Secondary | ICD-10-CM

## 2024-01-08 DIAGNOSIS — E871 Hypo-osmolality and hyponatremia: Secondary | ICD-10-CM | POA: Diagnosis present

## 2024-01-08 DIAGNOSIS — I629 Nontraumatic intracranial hemorrhage, unspecified: Principal | ICD-10-CM

## 2024-01-08 DIAGNOSIS — R0603 Acute respiratory distress: Secondary | ICD-10-CM | POA: Diagnosis present

## 2024-01-08 DIAGNOSIS — R29810 Facial weakness: Secondary | ICD-10-CM | POA: Diagnosis present

## 2024-01-08 DIAGNOSIS — D649 Anemia, unspecified: Secondary | ICD-10-CM | POA: Diagnosis present

## 2024-01-08 DIAGNOSIS — W19XXXA Unspecified fall, initial encounter: Secondary | ICD-10-CM | POA: Diagnosis present

## 2024-01-08 DIAGNOSIS — I610 Nontraumatic intracerebral hemorrhage in hemisphere, subcortical: Principal | ICD-10-CM

## 2024-01-08 DIAGNOSIS — R471 Dysarthria and anarthria: Secondary | ICD-10-CM | POA: Diagnosis present

## 2024-01-08 DIAGNOSIS — G8194 Hemiplegia, unspecified affecting left nondominant side: Secondary | ICD-10-CM | POA: Diagnosis present

## 2024-01-08 DIAGNOSIS — I169 Hypertensive crisis, unspecified: Secondary | ICD-10-CM | POA: Diagnosis not present

## 2024-01-08 DIAGNOSIS — F1010 Alcohol abuse, uncomplicated: Secondary | ICD-10-CM | POA: Diagnosis present

## 2024-01-08 DIAGNOSIS — I619 Nontraumatic intracerebral hemorrhage, unspecified: Secondary | ICD-10-CM | POA: Diagnosis present

## 2024-01-08 DIAGNOSIS — I10 Essential (primary) hypertension: Secondary | ICD-10-CM | POA: Diagnosis present

## 2024-01-08 DIAGNOSIS — E876 Hypokalemia: Secondary | ICD-10-CM | POA: Diagnosis not present

## 2024-01-08 DIAGNOSIS — R29704 NIHSS score 4: Secondary | ICD-10-CM

## 2024-01-08 DIAGNOSIS — Z823 Family history of stroke: Secondary | ICD-10-CM

## 2024-01-08 DIAGNOSIS — R270 Ataxia, unspecified: Secondary | ICD-10-CM | POA: Diagnosis present

## 2024-01-08 DIAGNOSIS — Z8249 Family history of ischemic heart disease and other diseases of the circulatory system: Secondary | ICD-10-CM

## 2024-01-08 DIAGNOSIS — Z6830 Body mass index (BMI) 30.0-30.9, adult: Secondary | ICD-10-CM

## 2024-01-08 LAB — CBC WITH DIFFERENTIAL/PLATELET
Abs Immature Granulocytes: 0.05 10*3/uL (ref 0.00–0.07)
Basophils Absolute: 0.1 10*3/uL (ref 0.0–0.1)
Basophils Relative: 1 %
Eosinophils Absolute: 0.1 10*3/uL (ref 0.0–0.5)
Eosinophils Relative: 1 %
HCT: 34 % — ABNORMAL LOW (ref 39.0–52.0)
Hemoglobin: 11.7 g/dL — ABNORMAL LOW (ref 13.0–17.0)
Immature Granulocytes: 0 %
Lymphocytes Relative: 19 %
Lymphs Abs: 2.7 10*3/uL (ref 0.7–4.0)
MCH: 34.7 pg — ABNORMAL HIGH (ref 26.0–34.0)
MCHC: 34.4 g/dL (ref 30.0–36.0)
MCV: 100.9 fL — ABNORMAL HIGH (ref 80.0–100.0)
Monocytes Absolute: 0.8 10*3/uL (ref 0.1–1.0)
Monocytes Relative: 6 %
Neutro Abs: 10.6 10*3/uL — ABNORMAL HIGH (ref 1.7–7.7)
Neutrophils Relative %: 73 %
Platelets: 325 10*3/uL (ref 150–400)
RBC: 3.37 MIL/uL — ABNORMAL LOW (ref 4.22–5.81)
RDW: 12 % (ref 11.5–15.5)
WBC: 14.4 10*3/uL — ABNORMAL HIGH (ref 4.0–10.5)
nRBC: 0 % (ref 0.0–0.2)

## 2024-01-08 LAB — ETHANOL: Alcohol, Ethyl (B): <15 mg/dL (ref <15)

## 2024-01-08 LAB — TROPONIN I (HIGH SENSITIVITY): Troponin I (High Sensitivity): 8 ng/L (ref <18)

## 2024-01-08 MED ORDER — NICARDIPINE HCL IN NACL 20-0.86 MG/200ML-% IV SOLN
3.0000 mg/h | INTRAVENOUS | Status: DC
  Administered 2024-01-08: 5 mg/h via INTRAVENOUS
  Administered 2024-01-09: 15 mg/h via INTRAVENOUS
  Filled 2024-01-08 (×3): qty 200

## 2024-01-08 NOTE — ED Provider Notes (Signed)
 [facility]-EMERGENCY DEPT [facility] Emergency Department Provider Note MRN:  [id number]  Arrival date & time: 01/08/24     Chief Complaint   Fall   History of Present Illness   Cody Bautista is a 73 y.o. year-old male presents to the ED with chief complaint of weakness.  Patient states that when he got out of bed this morning his legs felt weak and he fell to the ground.  He states that he was on the ground all day until his roommate found him.  Last known well was last night.  He had been drinking last night as well.  He states that he has not been able to stand and was not able to get off the ground himself.  He denies having any pain.  History provided by patient.   Review of Systems  Pertinent positive and negative review of systems noted in HPI.    Physical Exam   Vitals:   01/08/24 2216 01/08/24 2300  BP: (!) 211/107 (!) 207/118  Pulse: 84 84  Resp: 20 14  Temp: 97.7 F (36.5 C)   SpO2: 100% 100%    CONSTITUTIONAL:  non toxic-appearing, NAD NEURO:  Alert and oriented x 3, CN 3-12 grossly intact EYES:  eyes equal and reactive ENT/NECK:  Supple, no stridor  CARDIO:  normal rate, regular rhythm, appears well-perfused  PULM:  No respiratory distress, CTAB GI/GU:  non-distended,  MSK/SPINE:  No gross deformities, no edema, moves all extremities  SKIN:  no rash, atraumatic   *Additional and/or pertinent findings included in MDM below  Diagnostic and Interventional Summary    EKG Interpretation Date/Time:  Thursday Jan 08 2024 22:15:57 EDT Ventricular Rate:  82 PR Interval:  182 QRS Duration:  162 QT Interval:  433 QTC Calculation: 506 R Axis:   62  Text Interpretation: Sinus rhythm Left ventricular hypertrophy No previous tracing Confirmed by Guadalupe Lee ([id number]) on 01/08/2024 10:22:33 PM       Labs Reviewed  CBC WITH DIFFERENTIAL/PLATELET - Abnormal; Notable for the following components:      Result Value   WBC 14.4 (*)    RBC 3.37 (*)    Hemoglobin  11.7 (*)    HCT 34.0 (*)    MCV 100.9 (*)    MCH 34.7 (*)    Neutro Abs 10.6 (*)    All other components within normal limits  ETHANOL  RAPID URINE DRUG SCREEN, HOSP PERFORMED  CK  COMPREHENSIVE METABOLIC PANEL WITH GFR  TROPONIN I (HIGH SENSITIVITY)    CT HEAD WO CONTRAST ( )  Final Result    CT Cervical Spine Wo Contrast  Final Result    DG Chest Port 1 View    (Results Pending)  DG Pelvis Portable    (Results Pending)    Medications  nicardipine  (CARDENE ) 20mg  in 0.86% saline IV infusion (0.1 mg/ml) (has no administration in time range)     Procedures  /  Critical Care .Critical Care  Performed by: Sherel Dikes, PA-C Authorized by: Sherel Dikes, PA-C   Critical care provider statement:    Critical care time (minutes):  37   Critical care was necessary to treat or prevent imminent or life-threatening deterioration of the following conditions:  CNS failure or compromise   Critical care was time spent personally by me on the following activities:  Development of treatment plan with patient or surrogate, discussions with consultants, evaluation of patient's response to treatment, examination of patient, ordering and review of laboratory studies, ordering and  review of radiographic studies, ordering and performing treatments and interventions, pulse oximetry, re-evaluation of patient's condition and review of old charts   ED Course and Medical Decision Making  I have reviewed the triage vital signs, the nursing notes, and pertinent available records from the EMR.  Social Determinants Affecting Complexity of Care: Patient has no clinically significant social determinants affecting this chief complaint..   ED Course:    Medical Decision Making Patient here with lower extremity weakness.  He has reportedly been down all day.  He states that his legs did not support him earlier this morning when he got up.  He states he has not been able to stand.  CT head  notable for focal acute hemorrhage of the right thalamus.  No acute traumatic injuries to the cervical spine.  Patient is noted to be quite hypertensive to the 200s.  Will start Cardene  infusion.  I discussed case with neurology, Dr. Alecia Ames, who is appreciated for admitting the patient.  Amount and/or Complexity of Data Reviewed Labs: ordered. Radiology: ordered and independent interpretation performed.    Details: Intracranial hemorrhage present  Risk Prescription drug management. Decision regarding hospitalization.         Consultants: I consulted with Dr. Alecia Ames, who recommends Cardene  to 130-150 SBP and will admit.  Appreciate Dr. Alecia Ames for admitting..   Treatment and Plan: Patient's exam and diagnostic results are concerning for ICH.  Feel that patient will need admission to the hospital for further treatment and evaluation.  Patient seen by and discussed with attending physician, Dr. Maralee Senate, who agrees with the plan.  Final Clinical Impressions(s) / ED Diagnoses     ICD-10-CM   1. Intracranial bleed (HCC)  I62.9     2. Hypertensive crisis  I16.9       ED Discharge Orders     None         Discharge Instructions Discussed with and Provided to Patient:   Discharge Instructions   None      Sherel Dikes, PA-C 01/09/24 0038    Palumbo, April, MD 01/09/24 Keary Passey, April, MD 01/09/24 [phone]

## 2024-01-08 NOTE — ED Triage Notes (Signed)
 Pt BIB EMS from home. States that he fell this AM, has been on the ground since until his roommate found him. LKW was last night. Etoh last night, states that he only drinks 2 x a week. Left arm weakness and unable to stand due to weakness.   Cbg 90 199/113 83HR 100RA

## 2024-01-09 ENCOUNTER — Inpatient Hospital Stay (HOSPITAL_COMMUNITY)

## 2024-01-09 DIAGNOSIS — R29702 NIHSS score 2: Secondary | ICD-10-CM | POA: Diagnosis not present

## 2024-01-09 DIAGNOSIS — Z8249 Family history of ischemic heart disease and other diseases of the circulatory system: Secondary | ICD-10-CM | POA: Diagnosis not present

## 2024-01-09 DIAGNOSIS — D649 Anemia, unspecified: Secondary | ICD-10-CM | POA: Diagnosis present

## 2024-01-09 DIAGNOSIS — Z823 Family history of stroke: Secondary | ICD-10-CM | POA: Diagnosis not present

## 2024-01-09 DIAGNOSIS — G8194 Hemiplegia, unspecified affecting left nondominant side: Secondary | ICD-10-CM | POA: Diagnosis present

## 2024-01-09 DIAGNOSIS — R0603 Acute respiratory distress: Secondary | ICD-10-CM | POA: Diagnosis present

## 2024-01-09 DIAGNOSIS — R471 Dysarthria and anarthria: Secondary | ICD-10-CM | POA: Diagnosis present

## 2024-01-09 DIAGNOSIS — I619 Nontraumatic intracerebral hemorrhage, unspecified: Secondary | ICD-10-CM | POA: Diagnosis not present

## 2024-01-09 DIAGNOSIS — R29703 NIHSS score 3: Secondary | ICD-10-CM | POA: Diagnosis not present

## 2024-01-09 DIAGNOSIS — Z841 Family history of disorders of kidney and ureter: Secondary | ICD-10-CM | POA: Diagnosis not present

## 2024-01-09 DIAGNOSIS — R29704 NIHSS score 4: Secondary | ICD-10-CM | POA: Diagnosis present

## 2024-01-09 DIAGNOSIS — F1010 Alcohol abuse, uncomplicated: Secondary | ICD-10-CM

## 2024-01-09 DIAGNOSIS — Z6830 Body mass index (BMI) 30.0-30.9, adult: Secondary | ICD-10-CM | POA: Diagnosis not present

## 2024-01-09 DIAGNOSIS — I10 Essential (primary) hypertension: Secondary | ICD-10-CM

## 2024-01-09 DIAGNOSIS — E785 Hyperlipidemia, unspecified: Secondary | ICD-10-CM | POA: Diagnosis present

## 2024-01-09 DIAGNOSIS — E871 Hypo-osmolality and hyponatremia: Secondary | ICD-10-CM | POA: Diagnosis present

## 2024-01-09 DIAGNOSIS — I169 Hypertensive crisis, unspecified: Secondary | ICD-10-CM | POA: Diagnosis present

## 2024-01-09 DIAGNOSIS — E876 Hypokalemia: Secondary | ICD-10-CM | POA: Diagnosis present

## 2024-01-09 DIAGNOSIS — E66811 Obesity, class 1: Secondary | ICD-10-CM | POA: Diagnosis present

## 2024-01-09 DIAGNOSIS — R29810 Facial weakness: Secondary | ICD-10-CM | POA: Diagnosis present

## 2024-01-09 DIAGNOSIS — W19XXXA Unspecified fall, initial encounter: Secondary | ICD-10-CM | POA: Diagnosis present

## 2024-01-09 DIAGNOSIS — I610 Nontraumatic intracerebral hemorrhage in hemisphere, subcortical: Secondary | ICD-10-CM | POA: Diagnosis present

## 2024-01-09 DIAGNOSIS — M109 Gout, unspecified: Secondary | ICD-10-CM | POA: Diagnosis present

## 2024-01-09 DIAGNOSIS — R270 Ataxia, unspecified: Secondary | ICD-10-CM | POA: Diagnosis present

## 2024-01-09 LAB — COMPREHENSIVE METABOLIC PANEL WITH GFR
ALT: 40 U/L (ref 0–44)
AST: 73 U/L — ABNORMAL HIGH (ref 15–41)
Albumin: 2.4 g/dL — ABNORMAL LOW (ref 3.5–5.0)
Alkaline Phosphatase: 87 U/L (ref 38–126)
Anion gap: 10 (ref 5–15)
BUN: 13 mg/dL (ref 8–23)
CO2: 21 mmol/L — ABNORMAL LOW (ref 22–32)
Calcium: 8.3 mg/dL — ABNORMAL LOW (ref 8.9–10.3)
Chloride: 107 mmol/L (ref 98–111)
Creatinine, Ser: 0.88 mg/dL (ref 0.61–1.24)
GFR, Estimated: >60 mL/min (ref >60)
Glucose, Bld: 104 mg/dL — ABNORMAL HIGH (ref 70–99)
Potassium: 2.7 mmol/L — CL (ref 3.5–5.1)
Sodium: 138 mmol/L (ref 135–145)
Total Bilirubin: 1.7 mg/dL — ABNORMAL HIGH (ref 0.0–1.2)
Total Protein: 7.4 g/dL (ref 6.5–8.1)

## 2024-01-09 LAB — BASIC METABOLIC PANEL WITH GFR
Anion gap: 10 (ref 5–15)
BUN: 14 mg/dL (ref 8–23)
CO2: 21 mmol/L — ABNORMAL LOW (ref 22–32)
Calcium: 9.1 mg/dL (ref 8.9–10.3)
Chloride: 105 mmol/L (ref 98–111)
Creatinine, Ser: 0.97 mg/dL (ref 0.61–1.24)
GFR, Estimated: >60 mL/min (ref >60)
Glucose, Bld: 106 mg/dL — ABNORMAL HIGH (ref 70–99)
Potassium: 4.4 mmol/L (ref 3.5–5.1)
Sodium: 136 mmol/L (ref 135–145)

## 2024-01-09 LAB — ECHOCARDIOGRAM COMPLETE
Area-P 1/2: 2.45 cm2
Height: 68 in
Weight: 3174.62 [oz_av]

## 2024-01-09 LAB — CK: Total CK: 84 U/L (ref 49–397)

## 2024-01-09 LAB — RAPID URINE DRUG SCREEN, HOSP PERFORMED
Amphetamines: NOT DETECTED
Barbiturates: NOT DETECTED
Benzodiazepines: NOT DETECTED
Cocaine: NOT DETECTED
Opiates: NOT DETECTED
Tetrahydrocannabinol: NOT DETECTED

## 2024-01-09 LAB — CBC
HCT: 34.6 % — ABNORMAL LOW (ref 39.0–52.0)
Hemoglobin: 12.2 g/dL — ABNORMAL LOW (ref 13.0–17.0)
MCH: 35 pg — ABNORMAL HIGH (ref 26.0–34.0)
MCHC: 35.3 g/dL (ref 30.0–36.0)
MCV: 99.1 fL (ref 80.0–100.0)
Platelets: 322 10*3/uL (ref 150–400)
RBC: 3.49 MIL/uL — ABNORMAL LOW (ref 4.22–5.81)
RDW: 11.9 % (ref 11.5–15.5)
WBC: 13.5 10*3/uL — ABNORMAL HIGH (ref 4.0–10.5)
nRBC: 0 % (ref 0.0–0.2)

## 2024-01-09 LAB — TROPONIN I (HIGH SENSITIVITY): Troponin I (High Sensitivity): 9 ng/L (ref <18)

## 2024-01-09 LAB — MRSA NEXT GEN BY PCR, NASAL: MRSA by PCR Next Gen: NOT DETECTED

## 2024-01-09 MED ORDER — ACETAMINOPHEN 325 MG PO TABS
650.0000 mg | ORAL_TABLET | ORAL | Status: DC | PRN
  Administered 2024-01-11 – 2024-01-12 (×2): 650 mg via ORAL
  Filled 2024-01-09 (×2): qty 2

## 2024-01-09 MED ORDER — THIAMINE HCL 100 MG/ML IJ SOLN
100.0000 mg | Freq: Every day | INTRAMUSCULAR | Status: DC
  Filled 2024-01-09 (×3): qty 2

## 2024-01-09 MED ORDER — POTASSIUM CHLORIDE 10 MEQ/100ML IV SOLN
10.0000 meq | INTRAVENOUS | Status: AC
Start: 2024-01-09 — End: 2024-01-09
  Administered 2024-01-09 (×4): 10 meq via INTRAVENOUS
  Filled 2024-01-09 (×4): qty 100

## 2024-01-09 MED ORDER — PANTOPRAZOLE SODIUM 40 MG PO TBEC
40.0000 mg | DELAYED_RELEASE_TABLET | Freq: Every day | ORAL | Status: DC
  Administered 2024-01-09 – 2024-01-12 (×4): 40 mg via ORAL
  Filled 2024-01-09 (×4): qty 1

## 2024-01-09 MED ORDER — FOLIC ACID 1 MG PO TABS
1.0000 mg | ORAL_TABLET | Freq: Every day | ORAL | Status: DC
  Administered 2024-01-09 – 2024-01-13 (×5): 1 mg via ORAL
  Filled 2024-01-09 (×5): qty 1

## 2024-01-09 MED ORDER — POTASSIUM CHLORIDE CRYS ER 20 MEQ PO TBCR
30.0000 meq | EXTENDED_RELEASE_TABLET | Freq: Two times a day (BID) | ORAL | Status: DC
  Administered 2024-01-09: 30 meq via ORAL
  Filled 2024-01-09: qty 1

## 2024-01-09 MED ORDER — LORAZEPAM 1 MG PO TABS
1.0000 mg | ORAL_TABLET | ORAL | Status: AC | PRN
Start: 2024-01-09 — End: 2024-01-12

## 2024-01-09 MED ORDER — CHLORHEXIDINE GLUCONATE CLOTH 2 % EX PADS
6.0000 | MEDICATED_PAD | Freq: Every day | CUTANEOUS | Status: DC
  Administered 2024-01-09: 6 via TOPICAL

## 2024-01-09 MED ORDER — ACETAMINOPHEN 160 MG/5ML PO SOLN: 650.0000 mg | ORAL | Status: DC | PRN

## 2024-01-09 MED ORDER — HYDRALAZINE HCL 20 MG/ML IJ SOLN
10.0000 mg | INTRAMUSCULAR | Status: DC | PRN
  Administered 2024-01-09 – 2024-01-11 (×4): 10 mg via INTRAVENOUS
  Filled 2024-01-09 (×4): qty 1

## 2024-01-09 MED ORDER — LORAZEPAM 2 MG/ML IJ SOLN
1.0000 mg | INTRAMUSCULAR | Status: AC | PRN
  Administered 2024-01-11 (×2): 1 mg via INTRAVENOUS
  Filled 2024-01-09 (×2): qty 1

## 2024-01-09 MED ORDER — ALLOPURINOL 100 MG PO TABS
300.0000 mg | ORAL_TABLET | Freq: Every day | ORAL | Status: DC
  Administered 2024-01-09 – 2024-01-13 (×5): 300 mg via ORAL
  Filled 2024-01-09 (×2): qty 3
  Filled 2024-01-09: qty 1
  Filled 2024-01-09: qty 3
  Filled 2024-01-09: qty 1

## 2024-01-09 MED ORDER — ADULT MULTIVITAMIN W/MINERALS CH
1.0000 | ORAL_TABLET | Freq: Every day | ORAL | Status: DC
  Administered 2024-01-09 – 2024-01-13 (×5): 1 via ORAL
  Filled 2024-01-09 (×5): qty 1

## 2024-01-09 MED ORDER — PANTOPRAZOLE SODIUM 40 MG IV SOLR
40.0000 mg | Freq: Every day | INTRAVENOUS | Status: DC
  Administered 2024-01-09: 40 mg via INTRAVENOUS
  Filled 2024-01-09: qty 10

## 2024-01-09 MED ORDER — STROKE: EARLY STAGES OF RECOVERY BOOK
Freq: Once | Status: AC
  Filled 2024-01-09: qty 1

## 2024-01-09 MED ORDER — ENOXAPARIN SODIUM 40 MG/0.4ML IJ SOSY
40.0000 mg | PREFILLED_SYRINGE | Freq: Every day | INTRAMUSCULAR | Status: DC
  Administered 2024-01-10 – 2024-01-13 (×4): 40 mg via SUBCUTANEOUS
  Filled 2024-01-09 (×4): qty 0.4

## 2024-01-09 MED ORDER — ACETAMINOPHEN 650 MG RE SUPP: 650.0000 mg | RECTAL | Status: DC | PRN

## 2024-01-09 MED ORDER — SENNOSIDES-DOCUSATE SODIUM 8.6-50 MG PO TABS
1.0000 | ORAL_TABLET | Freq: Two times a day (BID) | ORAL | Status: DC
  Administered 2024-01-09 – 2024-01-13 (×9): 1 via ORAL
  Filled 2024-01-09 (×9): qty 1

## 2024-01-09 MED ORDER — LABETALOL HCL 5 MG/ML IV SOLN
10.0000 mg | INTRAVENOUS | Status: DC | PRN
Start: 2024-01-09 — End: 2024-01-13
  Filled 2024-01-09: qty 4

## 2024-01-09 MED ORDER — AMLODIPINE BESYLATE 5 MG PO TABS
5.0000 mg | ORAL_TABLET | Freq: Every day | ORAL | Status: DC
Start: 2024-01-09 — End: 2024-01-10
  Administered 2024-01-09 – 2024-01-10 (×2): 5 mg via ORAL
  Filled 2024-01-09 (×2): qty 1

## 2024-01-09 MED ORDER — THIAMINE MONONITRATE 100 MG PO TABS
100.0000 mg | ORAL_TABLET | Freq: Every day | ORAL | Status: DC
  Administered 2024-01-09 – 2024-01-13 (×5): 100 mg via ORAL
  Filled 2024-01-09 (×5): qty 1

## 2024-01-09 NOTE — Plan of Care (Signed)

## 2024-01-09 NOTE — Evaluation (Signed)
 Speech Language Pathology Evaluation Patient Details Name: Cody Bautista MRN: [id number] DOB: [birth year] Today's Date: 01/09/2024 Time:  -     Problem List:  Patient Active Problem List   Diagnosis Date Noted   ICH (intracerebral hemorrhage) (HCC) 01/09/2024   Pyorrhea gum disease 02/22/2012   Benign prostatic hyperplasia 02/22/2012   Back pain, chronic 02/22/2012   Gout 10/15/2011   Elevated blood pressure 10/15/2011   Past Medical History:  Past Medical History:  Diagnosis Date   Arthritis    lower back with muscle spasms   Gout    Hypertension    Past Surgical History:  Past Surgical History:  Procedure Laterality Date   WRIST SURGERY  1980s   (RIGHT)   Accident at work- Location manager at [employer]   HPI:  Cody Bautista is a 73 y.o. male with hx of hypertension who was in his normal state of health when he went to bed last night.  He also feels that he was in his normal state of health when he first woke up this morning, but then laid back down somewhere between 7 and 7:30.  When he tried to get up next which was not that much longer later, he had difficulty getting up.  Due to his inability to get out of bed, he was down for most of the day before being found by his remain in the main brought in the emergency department.  In the ER, he was evaluated with a head CT which reveals right thalamic hemorrhage.   Assessment / Plan / Recommendation Clinical Impression  Pt very pleasant, independent at baseline but does reports some memory impariemtn prior to admit. Pt says sometimes "I have to think on it, but it comes to me." Technically pt did not score well on SLUMS cognitive screen, but with some accommodations and cues to follow directions and for retrieval then pt did very well functionally. He has good mathematical reasoning skills, good memory storage but slow retrieval, attention, problem solving and awareness are good. Unable to draw a clock, but could point to placement of  numbers and read a clock well. Pt likely to require physical assistance, but may be back to baseline cognitively. Pt could f/u with SLP at next level of care during other rehab efforts.    SLP Assessment  SLP Recommendation/Assessment: All further Speech Lanaguage Pathology  needs can be addressed in the next venue of care    Recommendations for follow up therapy are one component of a multi-disciplinary discharge planning process, led by the attending physician.  Recommendations may be updated based on patient status, additional functional criteria and insurance authorization.    Follow Up Recommendations  Home health SLP    Assistance Recommended at Discharge  Intermittent Supervision/Assistance  Functional Status Assessment Patient has had a recent decline in their functional status and demonstrates the ability to make significant improvements in function in a reasonable and predictable amount of time.  Frequency and Duration           SLP Evaluation Cognition  Overall Cognitive Status: History of cognitive impairments - at baseline Arousal/Alertness: Awake/alert Orientation Level: Oriented to person;Oriented to place;Disoriented to time;Oriented to situation Attention: Focused;Sustained;Selective;Alternating Focused Attention: Appears intact Sustained Attention: Appears intact Selective Attention: Appears intact Alternating Attention: Appears intact Memory: Impaired Memory Impairment: Retrieval deficit Awareness: Appears intact Problem Solving: Appears intact Executive Function: Reasoning;Self Monitoring Reasoning: Appears intact Self Monitoring: Appears intact Safety/Judgment: Appears intact       Comprehension  Auditory Comprehension  Overall Auditory Comprehension: Appears within functional limits for tasks assessed    Expression Verbal Expression Overall Verbal Expression: Appears within functional limits for tasks assessed   Oral / Motor  Oral Motor/Sensory  Function Overall Oral Motor/Sensory Function: Within functional limits Motor Speech Overall Motor Speech: Appears within functional limits for tasks assessed            Lucinda Spells, Hardin Leys 01/09/2024, 2:12 PM

## 2024-01-09 NOTE — H&P (Signed)
 NEUROLOGY H&P NOTE   Date of service: Jan 09, 2024 Patient Name: Cody Bautista MRN:  [id number] DOB:  1951/02/21 Chief Complaint: "I could not get up"  History of Present Illness  Cody Bautista is a 73 y.o. male with hx of hypertension who was in his normal state of health when he went to bed last night.  He also feels that he was in his normal state of health when he first woke up this morning, but then laid back down somewhere between 7 and 7:30.  When he tried to get up next which was not that much longer later, he had difficulty getting up.  Due to his inability to get out of bed, he was down for most of the day before being found by his remain in the main brought in the emergency department.  In the ER, he was evaluated with a head CT which reveals right thalamic hemorrhage.   Last known well: 7 AM ICH Score: 0 tNKASE: Not offered due to ICH Thrombectomy: not offered due to ICH NIHSS components Score: Comment  1a Level of Conscious 0[]  1[]  2[]  3[]      1b LOC Questions 0[]  1[]  2[]       1c LOC Commands 0[]  1[]  2[]       2 Best Gaze 0[]  1[]  2[]       3 Visual 0[]  1[]  2[]  3[]      4 Facial Palsy 0[]  1[x]  2[]  3[]      5a Motor Arm - left 0[]  1[x]  2[]  3[]  4[]  UN[]    5b Motor Arm - Right 0[]  1[]  2[]  3[]  4[]  UN[]    6a Motor Leg - Left 0[]  1[x]  2[]  3[]  4[]  UN[]    6b Motor Leg - Right 0[]  1[]  2[]  3[]  4[]  UN[]    7 Limb Ataxia 0[]  1[]  2[]  UN[]      8 Sensory 0[]  1[]  2[]  UN[]      9 Best Language 0[]  1[]  2[]  3[]      10 Dysarthria 0[]  1[x]  2[]  UN[]      11 Extinct. and Inattention 0[]  1[]  2[]       TOTAL: 4      Past History   Past Medical History:  Diagnosis Date   Arthritis    lower back with muscle spasms   Gout    Hypertension    Past Surgical History:  Procedure Laterality Date   WRIST SURGERY  1980s   (RIGHT)   Accident at work- Location manager at [employer]   Family History  Problem Relation Age of Onset   Kidney disease Mother        kidney stones   Stroke Maternal Uncle 60    Stroke Sister 54   Hypertension Sister    Stroke Brother 59   Hypertension Brother    Cirrhosis Brother    Social History   Socioeconomic History   Marital status: Single    Spouse name: Not on file   Number of children: Not on file   Years of education: Not on file   Highest education level: Not on file  Occupational History   Not on file  Tobacco Use   Smoking status: Never   Smokeless tobacco: Never  Vaping Use   Vaping status: Never Used  Substance and Sexual Activity   Alcohol use: Yes    Alcohol/week: 5.0 - 6.0 standard drinks of alcohol    Types: 5 - 6 Standard drinks or equivalent per week   Drug use: No   Sexual activity: Not on file  Other Topics  Concern   Not on file  Social History Narrative   Not on file   Social Drivers of Health   Financial Resource Strain: Not on file  Food Insecurity: Not on file  Transportation Needs: Not on file  Physical Activity: Not on file  Stress: Not on file  Social Connections: Not on file   No Known Allergies  Medications  (Not in a hospital admission)    Vitals   Vitals:   01/09/24 0100 01/09/24 0115 01/09/24 0130 01/09/24 0145  BP: 138/78 139/72 115/72 114/66  Pulse: (!) 110 100 97 94  Resp: 14 14 14 14   Temp:      SpO2: 100% 99% 99% 100%  Weight:      Height:         Body mass index is 30.17 kg/m.  Physical Exam   Constitutional: Appears well-developed and well-nourished.  Psych: Affect appropriate to situation.  Eyes: No scleral injection.  HENT: No OP obstruction.  Head: Normocephalic.  Cardiovascular: Normal rate and regular rhythm.  Respiratory: Effort normal, non-labored breathing.  GI: Soft.  No distension. There is no tenderness.  Skin: WDI.   Neurologic Examination   Neuro: Mental Status: Patient is awake, alert, oriented to person, place, month, year, and situation. Patient is able to give a clear and coherent history. No signs of aphasia or neglect Cranial Nerves: II: Visual  Fields are full. Pupils are equal, round, and reactive to light.   III,IV, VI: EOMI without ptosis or diploplia.  V: Facial sensation is symmetric to temperature VII: Facial movement with mild left facial weakness VIII: hearing is intact to voice X: Uvula elevates symmetrically XII: tongue is midline without atrophy or fasciculations.  Motor: Tone is normal. Bulk is normal. 5/5 strength was present i on the right, he has mild weakness of the left arm and leg 4+/5 Sensory: Sensation is symmetric to light touch and temperature in the arms and legs. Cerebellar: FNF and HKS are intact bilaterally    Labs   CBC:  Recent Labs  Lab 01/08/24 2229  WBC 14.4*  NEUTROABS 10.6*  HGB 11.7*  HCT 34.0*  MCV 100.9*  PLT 325    Basic Metabolic Panel:  Lab Results  Component Value Date   NA 138 01/09/2024   K 2.7 (LL) 01/09/2024   CO2 21 (L) 01/09/2024   GLUCOSE 104 (H) 01/09/2024   BUN 13 01/09/2024   CREATININE 0.88 01/09/2024   CALCIUM 8.3 (L) 01/09/2024   GFRNONAA >60 01/09/2024   GFRAA 78 (L) 12/01/2013   Lipid Panel: No results found for: "LDLCALC" HgbA1c: No results found for: "HGBA1C" Urine Drug Screen: No results found for: "LABOPIA", "COCAINSCRNUR", "LABBENZ", "AMPHETMU", "THCU", "LABBARB"  Alcohol Level     Component Value Date/Time   ETH <15 01/08/2024 2229   INR No results found for: "INR" APTT No results found for: "APTT"   CT Head without contrast(Personally reviewed): Small right thalamic hemorrhage  Impression   Cody Bautista is a 73 y.o. male with a history of hypertension who presents with a small right thalamic hemorrhage, likely hypertensive in etiology.  Primary Diagnosis:  Nontraumatic intracerebral hemorrhage in hemisphere, subcortical  Secondary Diagnosis: Hypokalemia  Recommendations  1) Admit to ICU 2) no antiplatelets or anticoagulants 3) blood pressure control with goal systolic 130 - 150, continue carotid pain 4) Frequent neuro  checks 5) If symptoms worsen or there is decreased mental status, repeat stat head CT 6) PT,OT,ST 7) repeat CT in the a.m. 8) replete  hypokalemia, recheck BMP in the morning ______________________________________________________________________  This patient is critically ill and at significant risk of neurological worsening, death and care requires constant monitoring of vital signs, hemodynamics,respiratory and cardiac monitoring, neurological assessment, discussion with family, other specialists and medical decision making of high complexity. I spent 55 minutes of neurocritical care time  in the care of  this patient. This was time spent independent of any time provided by nurse practitioner or PA.  Ann Keto, MD [facility]   If 7pm- 7am, please page neurology on call as listed in AMION. 01/09/2024  5:38 AM

## 2024-01-09 NOTE — Progress Notes (Addendum)
 STROKE TEAM PROGRESS NOTE    SIGNIFICANT HOSPITAL EVENTS 5/8: Patient admitted with right thalamic ICH  INTERIM HISTORY/SUBJECTIVE Patient presented with left-sided weakness with CT scan showing small right thalamic hemorrhage without significant cytotoxic edema, midline shift or mass effect or hydrocephalus.  Blood pressure is adequately controlled and he is now off nicardipine  drip Patient has been weaned off nicardipine  overnight.  He continues to have some left-sided weakness.  He reports that he drinks about a fifth of liquor daily and has been placed on CIWA protocol.  OBJECTIVE  CBC    Component Value Date/Time   WBC 13.5 (H) 01/09/2024 0733   RBC 3.49 (L) 01/09/2024 0733   HGB 12.2 (L) 01/09/2024 0733   HCT 34.6 (L) 01/09/2024 0733   PLT 322 01/09/2024 0733   MCV 99.1 01/09/2024 0733   MCV 91.4 12/01/2013 1239   MCH 35.0 (H) 01/09/2024 0733   MCHC 35.3 01/09/2024 0733   RDW 11.9 01/09/2024 0733   LYMPHSABS 2.7 01/08/2024 2229   MONOABS 0.8 01/08/2024 2229   EOSABS 0.1 01/08/2024 2229   BASOSABS 0.1 01/08/2024 2229    BMET    Component Value Date/Time   NA 136 01/09/2024 0733   K 4.4 01/09/2024 0733   CL 105 01/09/2024 0733   CO2 21 (L) 01/09/2024 0733   GLUCOSE 106 (H) 01/09/2024 0733   BUN 14 01/09/2024 0733   CREATININE 0.97 01/09/2024 0733   CREATININE 1.15 12/01/2013 1216   CALCIUM 9.1 01/09/2024 0733   GFRNONAA >60 01/09/2024 0733    IMAGING past 24 hours CT HEAD WO CONTRAST ( ) Result Date: 01/09/2024 CLINICAL DATA:  Provided history: Stroke, hemorrhagic. EXAM: CT HEAD WITHOUT CONTRAST TECHNIQUE: Contiguous axial images were obtained from the base of the skull through the vertex without intravenous contrast. RADIATION DOSE REDUCTION: This exam was performed according to the departmental dose-optimization program which includes automated exposure control, adjustment of the mA and/or kV according to patient size and/or use of iterative reconstruction  technique. COMPARISON:  Head CT 01/08/2024. FINDINGS: Mildly motion degraded exam. Brain: Generalized cerebral atrophy. 1.5 x 1.1 cm acute hemorrhage within the right thalamus with mild surrounding edema, unchanged from the prior head CT of 01/08/2024 (remeasured on prior). No extra-axial fluid collection. No midline shift or hydrocephalus. Vascular: No hyperdense vessel. Skull: No calvarial fracture or aggressive osseous lesion. Sinuses/Orbits: No mass or acute finding within the imaged orbits. Mild partial opacification of left ethmoid air cells. Trace mucosal thickening within the right maxillary sinus. IMPRESSION: 1. Mildly motion degraded exam. 2. 1.5 x 1.1 cm acute hemorrhage within the right thalamus with mild surrounding edema, unchanged from the prior head CT of 01/08/2024. 3. Generalized cerebral atrophy. 4. Minor paranasal sinus disease, as described. Electronically Signed   By: Bascom Lily D.O.   On: 01/09/2024 08:11   DG Pelvis Portable Result Date: 01/08/2024 CLINICAL DATA:  Fall EXAM: PORTABLE PELVIS 1-2 VIEWS COMPARISON:  None Available. FINDINGS: There is no evidence of pelvic fracture or diastasis. No pelvic bone lesions are seen. Hip joints and SI joints symmetric. IMPRESSION: Negative. Electronically Signed   By: Janeece Mechanic M.D.   On: 01/08/2024 23:24   DG Chest Port 1 View Result Date: 01/08/2024 CLINICAL DATA:  Fall EXAM: PORTABLE CHEST 1 VIEW COMPARISON:  None Available. FINDINGS: The heart size and mediastinal contours are within normal limits. Both lungs are clear. The visualized skeletal structures are unremarkable. IMPRESSION: No active disease. Electronically Signed   By: Janeece Mechanic M.D.  On: 01/08/2024 23:23   CT HEAD WO CONTRAST ( ) Result Date: 01/08/2024 CLINICAL DATA:  Recent fall with headaches and neck pain, initial encounter EXAM: CT HEAD WITHOUT CONTRAST CT CERVICAL SPINE WITHOUT CONTRAST TECHNIQUE: Multidetector CT imaging of the head and cervical spine was  performed following the standard protocol without intravenous contrast. Multiplanar CT image reconstructions of the cervical spine were also generated. RADIATION DOSE REDUCTION: This exam was performed according to the departmental dose-optimization program which includes automated exposure control, adjustment of the mA and/or kV according to patient size and/or use of iterative reconstruction technique. COMPARISON:  06/09/2006 FINDINGS: CT HEAD FINDINGS Brain: There is focal area of acute hemorrhage within the right thalamus and extending into the posterior horn of right internal capsule. This measures 1.5 x 1.1 x 1.2 cm in greatest dimension. Some minimal surrounding edema is noted. No other focal area of hemorrhage is seen. Mild atrophic changes are noted. Mild chronic white ischemic changes are noted. Vascular: No hyperdense vessel or unexpected calcification. Skull: Normal. Negative for fracture or focal lesion. Sinuses/Orbits: No acute finding. Other: None. CT CERVICAL SPINE FINDINGS Alignment: Within normal limits. Skull base and vertebrae: 7 cervical segments are well visualized. Vertebral body height is well maintained. Multilevel osteophytic changes are seen. No acute fracture or acute facet abnormality is noted. The odontoid is within normal limits. Soft tissues and spinal canal: Surrounding soft tissue structures are within normal limits. Upper chest: Visualized lung apices are unremarkable. Other: None IMPRESSION: CT of the head: Focal acute hemorrhage in the right thalamus extending into the posterior horn of the internal capsule. No other focal abnormality is noted. CT of the cervical spine: Mild degenerative change without acute abnormality. Critical Value/emergent results were called by telephone at the time of interpretation on 01/08/2024 at 10:50 pm to Dr Moses Arenas , who verbally acknowledged these results. Electronically Signed   By: Violeta Grey M.D.   On: 01/08/2024 22:56   CT Cervical Spine Wo  Contrast Result Date: 01/08/2024 CLINICAL DATA:  Recent fall with headaches and neck pain, initial encounter EXAM: CT HEAD WITHOUT CONTRAST CT CERVICAL SPINE WITHOUT CONTRAST TECHNIQUE: Multidetector CT imaging of the head and cervical spine was performed following the standard protocol without intravenous contrast. Multiplanar CT image reconstructions of the cervical spine were also generated. RADIATION DOSE REDUCTION: This exam was performed according to the departmental dose-optimization program which includes automated exposure control, adjustment of the mA and/or kV according to patient size and/or use of iterative reconstruction technique. COMPARISON:  06/09/2006 FINDINGS: CT HEAD FINDINGS Brain: There is focal area of acute hemorrhage within the right thalamus and extending into the posterior horn of right internal capsule. This measures 1.5 x 1.1 x 1.2 cm in greatest dimension. Some minimal surrounding edema is noted. No other focal area of hemorrhage is seen. Mild atrophic changes are noted. Mild chronic white ischemic changes are noted. Vascular: No hyperdense vessel or unexpected calcification. Skull: Normal. Negative for fracture or focal lesion. Sinuses/Orbits: No acute finding. Other: None. CT CERVICAL SPINE FINDINGS Alignment: Within normal limits. Skull base and vertebrae: 7 cervical segments are well visualized. Vertebral body height is well maintained. Multilevel osteophytic changes are seen. No acute fracture or acute facet abnormality is noted. The odontoid is within normal limits. Soft tissues and spinal canal: Surrounding soft tissue structures are within normal limits. Upper chest: Visualized lung apices are unremarkable. Other: None IMPRESSION: CT of the head: Focal acute hemorrhage in the right thalamus extending into the posterior horn of the  internal capsule. No other focal abnormality is noted. CT of the cervical spine: Mild degenerative change without acute abnormality. Critical  Value/emergent results were called by telephone at the time of interpretation on 01/08/2024 at 10:50 pm to Dr Moses Arenas , who verbally acknowledged these results. Electronically Signed   By: Violeta Grey M.D.   On: 01/08/2024 22:56    Vitals:   01/09/24 0500 01/09/24 0600 01/09/24 0700 01/09/24 0800  BP: 118/64 115/78 126/73   Pulse: 78 67 72   Resp: 12 10 11    Temp:    98.1 F (36.7 C)  TempSrc:    Oral  SpO2: 99% 100% 99%   Weight:      Height:         PHYSICAL EXAM General:  Alert, well-nourished, well-developed patient in no acute distress Psych:  Mood and affect appropriate for situation CV: Regular rate and rhythm on monitor Respiratory:  Regular, unlabored respirations on room air GI: Abdomen soft and nontender   NEURO:  Mental Status: AA&Ox3, patient is able to give clear and coherent history Speech/Language: speech is without dysarthria or aphasia.    Cranial Nerves:  II: PERRL. Visual fields full.  III, IV, VI: EOMI. Eyelids elevate symmetrically.  V: Sensation is intact to light touch and symmetrical to face.  VII: Face is symmetrical resting and smiling VIII: hearing intact to voice. IX, X: Phonation is normal.  VQ:[id number] shrug 5/5. XII: tongue is midline without fasciculations. Motor: 5/5 strength to right upper and lower extremities, 4+/5 strength to left upper and lower extremities Tone: is normal and bulk is normal Sensation- Intact to light touch bilaterally.  Coordination: FTN intact bilaterally Gait- deferred  Most Recent NIH  1a Level of Conscious.: 0 1b LOC Questions: 0 1c LOC Commands: 0 2 Best Gaze: 0 3 Visual: 0 4 Facial Palsy: 0 5a Motor Arm - left: 1 5b Motor Arm - Right: 0 6a Motor Leg - Left: 1 6b Motor Leg - Right: 0 7 Limb Ataxia: 0 8 Sensory: 0 9 Best Language: 0 10 Dysarthria: 0 11 Extinct. and Inatten.: 0 TOTAL: 2   ASSESSMENT/PLAN  Mr. Cody Bautista is a 73 y.o. male with history of hypertension and alcoholism admitted  for acute onset left-sided weakness.  He was found to have a right thalamic ICH on head CT and has been admitted to the ICU.  He reports that he drinks a fifth of liquor daily and has been placed on CIWA protocol.  NIH on Admission 4  Intracerebral Hemorrhage:  right thalamic ICH Etiology: Likely hypertensive CT head focal hemorrhage in the right thalamus Repeat head CT 5/9 unchanged acute hemorrhage within right thalamus, generalized atrophy MRI pending MRA head and neck pending LDL pending HgbA1c pending VTE prophylaxis -SCDs No antithrombotic prior to admission, now on No antithrombotic secondary to ICH Therapy recommendations:  Pending Disposition: Pending  Hypertension Home meds: None Stable off nicardipine  As needed labetalol and hydralazine And amlodipine 5 mg daily Blood Pressure Goal: SBP between 130-150 for 24 hours and then less than 160   Hyperlipidemia Home meds: None LDL pending, goal < 70 Add in at discharge if LDL above goal  Risk for diabetes type II  Home meds: None HgbA1c pending, goal < 7.0  Alcohol abuse Patient reports that he drinks a fifth of liquor daily Begin CIWA protocol with lorazepam Advise cessation of alcohol consumption  Other Stroke Risk Factors Obesity, Body mass index is 30.17 kg/m., BMI >/= 30 associated with increased stroke  risk, recommend weight loss, diet and exercise as appropriate    Other Active Problems Gout-continue home allopurinol   Hospital day # 0  Patient seen by NP with MD, MD to edit note as needed. Cortney E Bucky Cardinal , MSN, AGACNP-BC [facility] See Amion for schedule and pager information 01/09/2024 12:14 PM   I have personally obtained history,examined this patient, reviewed notes, independently viewed imaging studies, participated in medical decision making and plan of care.ROS completed by me personally and pertinent positives fully documented  I have made any additions or clarifications directly  to the above note. Agree with note above.  Patient presented with sudden onset left-sided weakness due to small right thalamic hemorrhage likely from uncontrolled hypertension.  Continue close neurological observation strict blood pressure control with systolic goal between 130-154 for 24 hours and then below 160.  Mobilize out of bed.  Therapy consults.  Continue ongoing stroke workup.  Alcohol withdrawal precautions.  This patient is critically ill and at significant risk of neurological worsening, death and care requires constant monitoring of vital signs, hemodynamics,respiratory and cardiac monitoring, extensive review of multiple databases, frequent neurological assessment, discussion with family, other specialists and medical decision making of high complexity.I have made any additions or clarifications directly to the above note.This critical care time does not reflect procedure time, or teaching time or supervisory time of PA/NP/Med Resident etc but could involve care discussion time.  I spent 30 minutes of neurocritical care time  in the care of  this patient.      Ardella Beaver, MD Medical Director [facility] Pager: [phone] 01/09/2024 3:00 PM   To contact Stroke Continuity provider, please refer to [email or web address]. After hours, contact General Neurology

## 2024-01-09 NOTE — ED Notes (Signed)
 ED TO INPATIENT HANDOFF REPORT  ED Nurse Name and Phone #: Angelina Kempf RN [phone]   S Name/Age/Gender Juliene Offer 73 y.o. male Room/Bed: 026C/026C  Code Status   Code Status: Full Code  Home/SNF/Other Home Patient oriented to: self, place, time, and situation Is this baseline? Yes   Triage Complete: Triage complete  Chief Complaint ICH (intracerebral hemorrhage) (HCC) [I61.9]  Triage Note Pt BIB EMS from home. States that he fell this AM, has been on the ground since until his roommate found him. LKW was last night. Etoh last night, states that he only drinks 2 x a week. Left arm weakness and unable to stand due to weakness.   Cbg 90 199/113 83HR 100RA   Allergies No Known Allergies  Level of Care/Admitting Diagnosis ED Disposition     ED Disposition  Admit   Condition  --   Comment  Hospital Area: [facility] [[id number]]  Level of Care: ICU [6]  May admit patient to Ar[facility] Benes or [facility] Smalling if equivalent level of care is available:: No  Covid Evaluation: Asymptomatic - no recent exposure (last 10 days) testing not required  Diagnosis: ICH (intracerebral hemorrhage) [facility]) [[id number]]  Admitting Physician: Augustin Leber [[id number]]  Attending Physician: Alecia Ames, MCNEILL P [[id number]]  Certification:: I certify this patient will need inpatient services for at least 2 midnights  Expected Medical Readiness: 01/12/2024          B Medical/Surgery History Past Medical History:  Diagnosis Date   Arthritis    lower back with muscle spasms   Gout    Hypertension    Past Surgical History:  Procedure Laterality Date   WRIST SURGERY  1980s   (RIGHT)   Accident at work- Location manager at [employer]     A IV Location/Drains/Wounds Patient Lines/Drains/Airways Status     Active Line/Drains/Airways     Name Placement date Placement time Site Days   Peripheral IV 01/08/24 18 G Left Antecubital 01/08/24  2217  Antecubital  1   Peripheral IV  01/08/24 18 G Right Antecubital 01/08/24  2217  Antecubital  1            Intake/Output Last 24 hours No intake or output data in the 24 hours ending 01/09/24 0306  Labs/Imaging Results for orders placed or performed during the hospital encounter of 01/08/24 (from the past 48 hours)  CBC with Differential     Status: Abnormal   Collection Time: 01/08/24 10:29 PM  Result Value Ref Range   WBC 14.4 (H) 4.0 - 10.5 K/uL   RBC 3.37 (L) 4.22 - 5.81 MIL/uL   Hemoglobin 11.7 (L) 13.0 - 17.0 g/dL   HCT 47.8 (L) 29.5 - 62.1 %   MCV 100.9 (H) 80.0 - 100.0 fL   MCH 34.7 (H) 26.0 - 34.0 pg   MCHC 34.4 30.0 - 36.0 g/dL   RDW 30.8 65.7 - 84.6 %   Platelets 325 150 - 400 K/uL   nRBC 0.0 0.0 - 0.2 %   Neutrophils Relative % 73 %   Neutro Abs 10.6 (H) 1.7 - 7.7 K/uL   Lymphocytes Relative 19 %   Lymphs Abs 2.7 0.7 - 4.0 K/uL   Monocytes Relative 6 %   Monocytes Absolute 0.8 0.1 - 1.0 K/uL   Eosinophils Relative 1 %   Eosinophils Absolute 0.1 0.0 - 0.5 K/uL   Basophils Relative 1 %   Basophils Absolute 0.1 0.0 - 0.1 K/uL   Immature Granulocytes 0 %  Abs Immature Granulocytes 0.05 0.00 - 0.07 K/uL    Comment: Performed at [facility], [address] [place], [place] [address]  Troponin I (High Sensitivity)     Status: None   Collection Time: 01/08/24 10:29 PM  Result Value Ref Range   Troponin I (High Sensitivity) 8 <18 ng/L    Comment: (NOTE) Elevated high sensitivity troponin I (hsTnI) values and significant  changes across serial measurements may suggest ACS but many other  chronic and acute conditions are known to elevate hsTnI results.  Refer to the "Links" section for chest pain algorithms and additional  guidance. Performed at [facility], [address] [place], [place] [address]   Ethanol     Status: None   Collection Time: 01/08/24 10:29 PM  Result Value Ref Range   Alcohol, Ethyl (B) <15 <15 mg/dL    Comment: Please note change in reference  range. (NOTE) For medical purposes only. Performed at [facility], [address], [place], [place] [address]   CK     Status: None   Collection Time: 01/09/24 12:19 AM  Result Value Ref Range   Total CK 84 49 - 397 U/L    Comment: Performed at [facility], [address] [place], [place] [address]  Comprehensive metabolic panel with GFR     Status: Abnormal   Collection Time: 01/09/24 12:19 AM  Result Value Ref Range   Sodium 138 135 - 145 mmol/L   Potassium 2.7 (LL) 3.5 - 5.1 mmol/L    Comment: CRITICAL RESULT CALLED TO, READ BACK BY AND VERIFIED WITH Zakara Parkey, RN @0202  05.09.25 AALTOM   Chloride 107 98 - 111 mmol/L   CO2 21 (L) 22 - 32 mmol/L   Glucose, Bld 104 (H) 70 - 99 mg/dL    Comment: Glucose reference range applies only to samples taken after fasting for at least 8 hours.   BUN 13 8 - 23 mg/dL   Creatinine, Ser 6.57 0.61 - 1.24 mg/dL   Calcium 8.3 (L) 8.9 - 10.3 mg/dL   Total Protein 7.4 6.5 - 8.1 g/dL   Albumin 2.4 (L) 3.5 - 5.0 g/dL   AST 73 (H) 15 - 41 U/L   ALT 40 0 - 44 U/L   Alkaline Phosphatase 87 38 - 126 U/L   Total Bilirubin 1.7 (H) 0.0 - 1.2 mg/dL   GFR, Estimated >84 >69 mL/min    Comment: (NOTE) Calculated using the CKD-EPI Creatinine Equation (2021)    Anion gap 10 5 - 15    Comment: Performed at [facility], [address], [place], [place] [address]  Troponin I (High Sensitivity)     Status: None   Collection Time: 01/09/24 12:19 AM  Result Value Ref Range   Troponin I (High Sensitivity) 9 <18 ng/L    Comment: (NOTE) Elevated high sensitivity troponin I (hsTnI) values and significant  changes across serial measurements may suggest ACS but many other  chronic and acute conditions are known to elevate hsTnI results.  Refer to the "Links" section for chest pain algorithms and additional  guidance. Performed at [facility], [address] [place], [place] [address]    DG Pelvis Portable Result Date:  01/08/2024 CLINICAL DATA:  Fall EXAM: PORTABLE PELVIS 1-2 VIEWS COMPARISON:  None Available. FINDINGS: There is no evidence of pelvic fracture or diastasis. No pelvic bone lesions are seen. Hip joints and SI joints symmetric. IMPRESSION: Negative. Electronically  Signed   By: Janeece Mechanic M.D.   On: 01/08/2024 23:24   DG Chest Port 1 View Result Date: 01/08/2024 CLINICAL DATA:  Fall EXAM: PORTABLE CHEST 1 VIEW COMPARISON:  None Available. FINDINGS: The heart size and mediastinal contours are within normal limits. Both lungs are clear. The visualized skeletal structures are unremarkable. IMPRESSION: No active disease. Electronically Signed   By: Janeece Mechanic M.D.   On: 01/08/2024 23:23   CT HEAD WO CONTRAST ( ) Result Date: 01/08/2024 CLINICAL DATA:  Recent fall with headaches and neck pain, initial encounter EXAM: CT HEAD WITHOUT CONTRAST CT CERVICAL SPINE WITHOUT CONTRAST TECHNIQUE: Multidetector CT imaging of the head and cervical spine was performed following the standard protocol without intravenous contrast. Multiplanar CT image reconstructions of the cervical spine were also generated. RADIATION DOSE REDUCTION: This exam was performed according to the departmental dose-optimization program which includes automated exposure control, adjustment of the mA and/or kV according to patient size and/or use of iterative reconstruction technique. COMPARISON:  06/09/2006 FINDINGS: CT HEAD FINDINGS Brain: There is focal area of acute hemorrhage within the right thalamus and extending into the posterior horn of right internal capsule. This measures 1.5 x 1.1 x 1.2 cm in greatest dimension. Some minimal surrounding edema is noted. No other focal area of hemorrhage is seen. Mild atrophic changes are noted. Mild chronic white ischemic changes are noted. Vascular: No hyperdense vessel or unexpected calcification. Skull: Normal. Negative for fracture or focal lesion. Sinuses/Orbits: No acute finding. Other: None. CT  CERVICAL SPINE FINDINGS Alignment: Within normal limits. Skull base and vertebrae: 7 cervical segments are well visualized. Vertebral body height is well maintained. Multilevel osteophytic changes are seen. No acute fracture or acute facet abnormality is noted. The odontoid is within normal limits. Soft tissues and spinal canal: Surrounding soft tissue structures are within normal limits. Upper chest: Visualized lung apices are unremarkable. Other: None IMPRESSION: CT of the head: Focal acute hemorrhage in the right thalamus extending into the posterior horn of the internal capsule. No other focal abnormality is noted. CT of the cervical spine: Mild degenerative change without acute abnormality. Critical Value/emergent results were called by telephone at the time of interpretation on 01/08/2024 at 10:50 pm to Dr Moses Arenas , who verbally acknowledged these results. Electronically Signed   By: Violeta Grey M.D.   On: 01/08/2024 22:56   CT Cervical Spine Wo Contrast Result Date: 01/08/2024 CLINICAL DATA:  Recent fall with headaches and neck pain, initial encounter EXAM: CT HEAD WITHOUT CONTRAST CT CERVICAL SPINE WITHOUT CONTRAST TECHNIQUE: Multidetector CT imaging of the head and cervical spine was performed following the standard protocol without intravenous contrast. Multiplanar CT image reconstructions of the cervical spine were also generated. RADIATION DOSE REDUCTION: This exam was performed according to the departmental dose-optimization program which includes automated exposure control, adjustment of the mA and/or kV according to patient size and/or use of iterative reconstruction technique. COMPARISON:  06/09/2006 FINDINGS: CT HEAD FINDINGS Brain: There is focal area of acute hemorrhage within the right thalamus and extending into the posterior horn of right internal capsule. This measures 1.5 x 1.1 x 1.2 cm in greatest dimension. Some minimal surrounding edema is noted. No other focal area of hemorrhage is seen.  Mild atrophic changes are noted. Mild chronic white ischemic changes are noted. Vascular: No hyperdense vessel or unexpected calcification. Skull: Normal. Negative for fracture or focal lesion. Sinuses/Orbits: No acute finding. Other: None. CT CERVICAL SPINE FINDINGS Alignment: Within normal limits. Skull base and vertebrae: 7  cervical segments are well visualized. Vertebral body height is well maintained. Multilevel osteophytic changes are seen. No acute fracture or acute facet abnormality is noted. The odontoid is within normal limits. Soft tissues and spinal canal: Surrounding soft tissue structures are within normal limits. Upper chest: Visualized lung apices are unremarkable. Other: None IMPRESSION: CT of the head: Focal acute hemorrhage in the right thalamus extending into the posterior horn of the internal capsule. No other focal abnormality is noted. CT of the cervical spine: Mild degenerative change without acute abnormality. Critical Value/emergent results were called by telephone at the time of interpretation on 01/08/2024 at 10:50 pm to Dr Moses Arenas , who verbally acknowledged these results. Electronically Signed   By: Violeta Grey M.D.   On: 01/08/2024 22:56    Pending Labs Unresulted Labs (From admission, onward)     Start     Ordered   01/08/24 2307  Rapid urine drug screen (hospital performed)  ONCE - STAT,   STAT        01/08/24 2306            Vitals/Pain Today's Vitals   01/09/24 0145 01/09/24 0200 01/09/24 0215 01/09/24 0255  BP: 114/66 116/66 115/66   Pulse: 94 90 92   Resp: 14 13 15    Temp:    97.9 F (36.6 C)  TempSrc:    Oral  SpO2: 100% 100% 99%   Weight:      Height:      PainSc:        Isolation Precautions No active isolations  Medications Medications  nicardipine  (CARDENE ) 20mg  in 0.86% saline 200ml IV infusion (0.1 mg/ml) (10 mg/hr Intravenous Rate/Dose Change 01/09/24 0147)   stroke: early stages of recovery book (has no administration in time range)   acetaminophen  (TYLENOL ) tablet 650 mg (has no administration in time range)    Or  acetaminophen  (TYLENOL ) 160 MG/5ML solution 650 mg (has no administration in time range)    Or  acetaminophen  (TYLENOL ) suppository 650 mg (has no administration in time range)  senna-docusate (Senokot-S) tablet 1 tablet (has no administration in time range)  pantoprazole (PROTONIX) injection 40 mg (40 mg Intravenous Given 01/09/24 0028)  potassium chloride 10 mEq in 100 mL IVPB (10 mEq Intravenous New Bag/Given 01/09/24 0249)  potassium chloride (KLOR-CON M) CR tablet 30 mEq (30 mEq Oral Given 01/09/24 0244)    Mobility Normally walks at home, but is too weak to ambulate     Focused Assessments Neuro Assessment Handoff:  Swallow screen pass? Yes  Cardiac Rhythm: Sinus tachycardia (admit) NIH Stroke Scale  Dizziness Present: No Headache Present: No Interval: Initial Level of Consciousness (1a.)   : Alert, keenly responsive LOC Questions (1b. )   : Answers both questions correctly LOC Commands (1c. )   : Performs both tasks correctly Best Gaze (2. )  : Normal Visual (3. )  : No visual loss Facial Palsy (4. )    : Normal symmetrical movements Motor Arm, Left (5a. )   : Drift Motor Arm, Right (5b. ) : No drift Motor Leg, Left (6a. )  : No drift Motor Leg, Right (6b. ) : No drift Limb Ataxia (7. ): Absent Sensory (8. )  : Normal, no sensory loss Best Language (9. )  : No aphasia Dysarthria (10. ): Normal Extinction/Inattention (11.)   : No Abnormality Complete NIHSS TOTAL: 1     Neuro Assessment: Exceptions to WDL Neuro Checks:   Initial (01/08/24 2348)  Has TPA been  given? No If patient is a Neuro Trauma and patient is going to OR before floor call report to 4N Charge nurse: [phone] or [phone]   R Recommendations: See Admitting Provider Note  Report given to:   Additional Notes:

## 2024-01-09 NOTE — ED Notes (Signed)
 Report given to [facility], RN

## 2024-01-09 NOTE — ED Notes (Signed)
 Called 4N, 4N staff stated that accepting nurse is giving report to another. This nurse gave called back number.

## 2024-01-09 NOTE — Progress Notes (Signed)
 Echocardiogram 2D Echocardiogram has been performed.  Cody Bautista Nakeitha Milligan RDCS 01/09/2024, 10:53 AM

## 2024-01-09 NOTE — TOC CM/SW Note (Signed)
 Transition of Care [facility]) - Inpatient Brief Assessment   Patient Details  Name: Cody Bautista MRN: [id number] Date of Birth: Jul 21, 1951  Transition of Care [facility]) CM/SW Contact:    Phoebie Shad M, RN Phone Number: 01/09/2024, 4:15 PM   Clinical Narrative: Cody Bautista is a 73 y.o. male with a history of hypertension who presents with a small right thalamic hemorrhage, likely hypertensive in etiology. PTA, pt independent, rents a room in boarding house.  Awaiting PT/OT evaluations; please consult TOC as needs arise.   Transition of Care Asessment: Insurance and Status: Insurance coverage has been reviewed Patient has primary care physician: No Home environment has been reviewed: Rents a room in boarding house Prior level of function:: Independent; poor hygiene, per RN Prior/Current Home Services: No current home services Social Drivers of Health Review: SDOH reviewed needs interventions Readmission risk has been reviewed: Yes Transition of care needs: transition of care needs identified, TOC will continue to follow  Calla Catchings, RN, BSN  Trauma/Neuro ICU Case Manager [phone]

## 2024-01-10 ENCOUNTER — Inpatient Hospital Stay (HOSPITAL_COMMUNITY)

## 2024-01-10 DIAGNOSIS — F1010 Alcohol abuse, uncomplicated: Secondary | ICD-10-CM | POA: Diagnosis not present

## 2024-01-10 DIAGNOSIS — R29703 NIHSS score 3: Secondary | ICD-10-CM

## 2024-01-10 DIAGNOSIS — I10 Essential (primary) hypertension: Secondary | ICD-10-CM | POA: Diagnosis not present

## 2024-01-10 DIAGNOSIS — I619 Nontraumatic intracerebral hemorrhage, unspecified: Secondary | ICD-10-CM | POA: Diagnosis not present

## 2024-01-10 LAB — BASIC METABOLIC PANEL WITH GFR
Anion gap: 11 (ref 5–15)
BUN: 12 mg/dL (ref 8–23)
CO2: 21 mmol/L — ABNORMAL LOW (ref 22–32)
Calcium: 9.5 mg/dL (ref 8.9–10.3)
Chloride: 105 mmol/L (ref 98–111)
Creatinine, Ser: 1.07 mg/dL (ref 0.61–1.24)
GFR, Estimated: >60 mL/min (ref >60)
Glucose, Bld: 110 mg/dL — ABNORMAL HIGH (ref 70–99)
Potassium: 4.1 mmol/L (ref 3.5–5.1)
Sodium: 137 mmol/L (ref 135–145)

## 2024-01-10 LAB — CBC
HCT: 34.5 % — ABNORMAL LOW (ref 39.0–52.0)
Hemoglobin: 11.8 g/dL — ABNORMAL LOW (ref 13.0–17.0)
MCH: 35 pg — ABNORMAL HIGH (ref 26.0–34.0)
MCHC: 34.2 g/dL (ref 30.0–36.0)
MCV: 102.4 fL — ABNORMAL HIGH (ref 80.0–100.0)
Platelets: 250 10*3/uL (ref 150–400)
RBC: 3.37 MIL/uL — ABNORMAL LOW (ref 4.22–5.81)
RDW: 12.1 % (ref 11.5–15.5)
WBC: 12.7 10*3/uL — ABNORMAL HIGH (ref 4.0–10.5)
nRBC: 0 % (ref 0.0–0.2)

## 2024-01-10 LAB — HEMOGLOBIN A1C
Hgb A1c MFr Bld: 4.3 % — ABNORMAL LOW (ref 4.8–5.6)
Mean Plasma Glucose: 76.71 mg/dL

## 2024-01-10 LAB — GLUCOSE, CAPILLARY
Glucose-Capillary: 155 mg/dL — ABNORMAL HIGH (ref 70–99)
Glucose-Capillary: 137 mg/dL — ABNORMAL HIGH (ref 70–99)

## 2024-01-10 LAB — LIPID PANEL
Cholesterol: 193 mg/dL (ref 0–200)
HDL: 39 mg/dL — ABNORMAL LOW (ref >40)
LDL Cholesterol: 137 mg/dL — ABNORMAL HIGH (ref 0–99)
Total CHOL/HDL Ratio: 4.9 ratio
Triglycerides: 84 mg/dL (ref <150)
VLDL: 17 mg/dL (ref 0–40)

## 2024-01-10 MED ORDER — AMLODIPINE BESYLATE 5 MG PO TABS
10.0000 mg | ORAL_TABLET | Freq: Every day | ORAL | Status: DC
  Administered 2024-01-11 – 2024-01-13 (×3): 10 mg via ORAL
  Filled 2024-01-10 (×3): qty 2

## 2024-01-10 NOTE — Evaluation (Signed)
 Occupational Therapy Evaluation Patient Details Name: Cody Bautista MRN: [id number] DOB: 06-01-1951 Today's Date: 01/10/2024   History of Present Illness   Pt is a 73 y.o. male presenting 5/8 after fall at home. Endorsing ETOH use the night before.  CTH with R thalamic hemorrhage. PMH significant for HTN, gout, BPH.     Clinical Impressions PTA, pt was independent living with a roommate he reports he moved in with one week ago. Prior to moving in with friend lived in ALF. Upon eval, pt presents with decreased safety, problem solving, decreased LUE strength and decreased coordination. Pt currently requires +2 mod A for transfers on eval and total A for toileting. Due to significant change in status, Patient will benefit from continued inpatient follow up therapy, <3 hours/day      If plan is discharge home, recommend the following:   A little help with walking and/or transfers;A little help with bathing/dressing/bathroom;Assistance with cooking/housework;Help with stairs or ramp for entrance;Assist for transportation     Functional Status Assessment   Patient has had a recent decline in their functional status and demonstrates the ability to make significant improvements in function in a reasonable and predictable amount of time.     Equipment Recommendations   Other (comment) (defer)     Recommendations for Other Services         Precautions/Restrictions   Precautions Precautions: Fall Restrictions Weight Bearing Restrictions Per Provider Order: No     Mobility Bed Mobility Overal bed mobility: Needs Assistance Bed Mobility: Supine to Sit, Sit to Supine     Supine to sit: Min assist Sit to supine: Min assist   General bed mobility comments: min A for scooting toward EOB, slowed speed and increased time for problem solving. Pt needing min A to bring LLE back into bed    Transfers Overall transfer level: Needs assistance Equipment used: Rolling walker (2  wheels) Transfers: Sit to/from Stand, Bed to chair/wheelchair/BSC Sit to Stand: Mod assist, +2 physical assistance, +2 safety/equipment     Step pivot transfers: Mod assist, +2 physical assistance, +2 safety/equipment     General transfer comment: Mod A for rise up from EOB and BSC with cues for hand placement. Pt needing mod A as well for SPT to BSC secondary to LLE buckling and need for dense cueing to use BUE to facilitate steps with RW and for safety with RW. Progressing to min A +2 back to bed with pivotal steps toward R with decr LLE buckling and improved RW placement      Balance Overall balance assessment: Needs assistance Sitting-balance support: No upper extremity supported, Feet supported Sitting balance-Leahy Scale: Fair     Standing balance support: Bilateral upper extremity supported, During functional activity, Reliant on assistive device for balance Standing balance-Leahy Scale: Poor Standing balance comment: reliant on RW and external assist                           ADL either performed or assessed with clinical judgement   ADL Overall ADL's : Needs assistance/impaired Eating/Feeding: Set up;Bed level Eating/Feeding Details (indicate cue type and reason): tremor noted Grooming: Set up;Sitting   Upper Body Bathing: Minimal assistance;Sitting   Lower Body Bathing: Moderate assistance;Sit to/from stand   Upper Body Dressing : Minimal assistance;Sitting   Lower Body Dressing: Moderate assistance;Sit to/from stand   Toilet Transfer: Moderate assistance;+2 for physical assistance;+2 for safety/equipment;Stand-pivot;BSC/3in1;Rolling walker (2 wheels) Toilet Transfer Details (indicate cue type and reason):  cues for sequencing, safety, problem solving, pushing through BUE to compensate for LLE weakness Toileting- Clothing Manipulation and Hygiene: Total assistance;Sit to/from stand       Functional mobility during ADLs: Moderate assistance;+2 for  safety/equipment;+2 for physical assistance;Cueing for sequencing;Rolling walker (2 wheels);Cueing for safety       Vision Baseline Vision/History: 0 No visual deficits (readers) Ability to See in Adequate Light: 0 Adequate Patient Visual Report: No change from baseline Vision Assessment?: No apparent visual deficits Additional Comments: Pt with overshooting during finger to nose but denies visual changes. Able to read large fonts will continue to assess     Perception Perception: Impaired Preception Impairment Details: Inattention/Neglect Perception-Other Comments: L inattention   Praxis         Pertinent Vitals/Pain Pain Assessment Pain Assessment: Faces Faces Pain Scale: Hurts little more Pain Location: L knee gout pain Pain Descriptors / Indicators: Aching, Guarding Pain Intervention(s): Limited activity within patient's tolerance, Monitored during session     Extremity/Trunk Assessment Upper Extremity Assessment Upper Extremity Assessment: Generalized weakness;Left hand dominant;RUE deficits/detail;LUE deficits/detail RUE Deficits / Details: RUE with 3+/5 grip strength, 4+/5 push/pull . tremor with self feeding. decr finger to nose RUE Coordination: decreased fine motor LUE Deficits / Details: LUE with 4/5 grip strength, decr finger to nose, decr awareness of LUE with mobility needing cues for placement and proprioception AROM shoulder 0-120. Uses functionally with increased time and attention. Tremor present but less significant than R LUE Coordination: decreased fine motor;decreased gross motor   Lower Extremity Assessment Lower Extremity Assessment: Defer to PT evaluation       Communication Communication Communication: No apparent difficulties   Cognition Arousal: Alert Behavior During Therapy: WFL for tasks assessed/performed Cognition: No family/caregiver present to determine baseline             OT - Cognition Comments: Pt is oriented and follows basic  commands with increased time. Cues for safety and problem solving during all transfers                 Following commands: Impaired Following commands impaired: Follows one step commands with increased time, Follows multi-step commands inconsistently     Cueing  General Comments   Cueing Techniques: Verbal cues;Tactile cues  VSS   Exercises     Shoulder Instructions      Home Living Family/patient expects to be discharged to:: Private residence Living Arrangements: Other (Comment) (friend) Available Help at Discharge: Friend(s) Type of Home: House Home Access: Ramped entrance     Home Layout: One level     Bathroom Shower/Tub: Chief Strategy Officer: Standard     Home Equipment: None      Lives With: Friend(s)    Prior Functioning/Environment Prior Level of Function : Independent/Modified Independent             Mobility Comments: pt reports no equipment ADLs Comments: Pt reports being independent in ADL and that he was doing the cooking    OT Problem List: Decreased strength;Decreased activity tolerance;Impaired balance (sitting and/or standing);Decreased cognition;Decreased safety awareness;Decreased coordination;Decreased range of motion;Decreased knowledge of use of DME or AE;Impaired UE functional use   OT Treatment/Interventions: Self-care/ADL training;Therapeutic exercise;DME and/or AE instruction;Therapeutic activities;Cognitive remediation/compensation;Patient/family education;Balance training      OT Goals(Current goals can be found in the care plan section)   Acute Rehab OT Goals Patient Stated Goal: go home OT Goal Formulation: With patient Time For Goal Achievement: 01/24/24 Potential to Achieve Goals: Good   OT  Frequency:  Min 1X/week    Co-evaluation              AM-PAC OT "6 Clicks" Daily Activity     Outcome Measure Help from another person eating meals?: A Little Help from another person taking care of  personal grooming?: A Little Help from another person toileting, which includes using toliet, bedpan, or urinal?: A Lot Help from another person bathing (including washing, rinsing, drying)?: A Lot Help from another person to put on and taking off regular upper body clothing?: A Little Help from another person to put on and taking off regular lower body clothing?: A Lot 6 Click Score: 15   End of Session Equipment Utilized During Treatment: Gait belt;Rolling walker (2 wheels) Nurse Communication: Mobility status  Activity Tolerance: Patient tolerated treatment well Patient left: in bed;with call bell/phone within reach;with bed alarm set  OT Visit Diagnosis: Unsteadiness on feet (R26.81);Muscle weakness (generalized) (M62.81);History of falling (Z91.81);Other symptoms and signs involving cognitive function                Time: 1021-1053 OT Time Calculation (min): 32 min Charges:  OT General Charges $OT Visit: 1 Visit OT Evaluation $OT Eval Moderate Complexity: 1 Mod  Karilyn Ouch, OTR/L [facility] Acute Rehabilitation Office: [phone]   Emery Hans 01/10/2024, 12:08 PM

## 2024-01-10 NOTE — Plan of Care (Signed)

## 2024-01-10 NOTE — Progress Notes (Signed)
 STROKE TEAM PROGRESS NOTE    SIGNIFICANT HOSPITAL EVENTS 5/8: Patient admitted with right thalamic ICH  INTERIM HISTORY/SUBJECTIVE Patient presented with left-sided weakness with CT scan showing small right thalamic hemorrhage without significant cytotoxic edema, midline shift or mass effect or hydrocephalus.    Doing well. Feels jittery otherwise ok.   on CIWA protocol.  MRI/A still pending. Per Radiology will need KUB first. Will try to get today.  Transfer out of ICU.  OBJECTIVE  CBC    Component Value Date/Time   WBC 12.7 (H) 01/10/2024 0651   RBC 3.37 (L) 01/10/2024 0651   HGB 11.8 (L) 01/10/2024 0651   HCT 34.5 (L) 01/10/2024 0651   PLT 250 01/10/2024 0651   MCV 102.4 (H) 01/10/2024 0651   MCV 91.4 12/01/2013 1239   MCH 35.0 (H) 01/10/2024 0651   MCHC 34.2 01/10/2024 0651   RDW 12.1 01/10/2024 0651   LYMPHSABS 2.7 01/08/2024 2229   MONOABS 0.8 01/08/2024 2229   EOSABS 0.1 01/08/2024 2229   BASOSABS 0.1 01/08/2024 2229    BMET    Component Value Date/Time   NA 136 01/09/2024 0733   K 4.4 01/09/2024 0733   CL 105 01/09/2024 0733   CO2 21 (L) 01/09/2024 0733   GLUCOSE 106 (H) 01/09/2024 0733   BUN 14 01/09/2024 0733   CREATININE 0.97 01/09/2024 0733   CREATININE 1.15 12/01/2013 1216   CALCIUM 9.1 01/09/2024 0733   GFRNONAA >60 01/09/2024 0733    IMAGING past 24 hours ECHOCARDIOGRAM COMPLETE Result Date: 01/09/2024    ECHOCARDIOGRAM REPORT   Patient Name:   Cody Bautista Date of Exam: 01/09/2024 Medical Rec #:  [id number]       Height:       68.0 in Accession #:    [id number]      Weight:       198.4 lb Date of Birth:  Sep 24, 1950       BSA:          2.037 m Patient Age:    73 years        BP:           128/79 mmHg Patient Gender: M               HR:           78 bpm. Exam Location:  Inpatient Procedure: 2D Echo, Color Doppler and Cardiac Doppler (Both Spectral and Color            Flow Doppler were utilized during procedure). Indications:    Stroke i36.9   History:        Patient has no prior history of Echocardiogram examinations.                 Risk Factors:Hypertension.  Sonographer:    Sherline Distel Senior RDCS Referring Phys: [phone] MCNEILL P KIRKPATRICK  Sonographer Comments: No true parasternal window IMPRESSIONS  1. Left ventricular ejection fraction, by estimation, is 65 to 70%. The left ventricle has normal function. The left ventricle has no regional wall motion abnormalities. There is moderate asymmetric left ventricular hypertrophy of the septal segment. Left ventricular diastolic parameters are consistent with Grade I diastolic dysfunction (impaired relaxation).  2. Right ventricular systolic function is normal. The right ventricular size is normal. Tricuspid regurgitation signal is inadequate for assessing PA pressure.  3. The mitral valve is normal in structure. Trivial mitral valve regurgitation. No evidence of mitral stenosis.  4. The aortic valve has an indeterminant number of cusps. There  is mild calcification of the aortic valve. There is mild thickening of the aortic valve. Aortic valve regurgitation is not visualized. Aortic valve sclerosis is present, with no evidence of  aortic valve stenosis.  5. The inferior vena cava is normal in size with greater than 50% respiratory variability, suggesting right atrial pressure of 3 mmHg. FINDINGS  Left Ventricle: Left ventricular ejection fraction, by estimation, is 65 to 70%. The left ventricle has normal function. The left ventricle has no regional wall motion abnormalities. Strain was performed and the global longitudinal strain is indeterminate. The left ventricular internal cavity size was normal in size. There is moderate asymmetric left ventricular hypertrophy of the septal segment. Left ventricular diastolic parameters are consistent with Grade I diastolic dysfunction (impaired relaxation). Right Ventricle: The right ventricular size is normal. No increase in right ventricular wall thickness. Right  ventricular systolic function is normal. Tricuspid regurgitation signal is inadequate for assessing PA pressure. Left Atrium: Left atrial size was normal in size. Right Atrium: Right atrial size was normal in size. Pericardium: There is no evidence of pericardial effusion. Mitral Valve: The mitral valve is normal in structure. Trivial mitral valve regurgitation. No evidence of mitral valve stenosis. Tricuspid Valve: The tricuspid valve is normal in structure. Tricuspid valve regurgitation is mild . No evidence of tricuspid stenosis. Aortic Valve: The aortic valve has an indeterminant number of cusps. There is mild calcification of the aortic valve. There is mild thickening of the aortic valve. Aortic valve regurgitation is not visualized. Aortic valve sclerosis is present, with no evidence of aortic valve stenosis. Pulmonic Valve: The pulmonic valve was not well visualized. Pulmonic valve regurgitation is not visualized. No evidence of pulmonic stenosis. Aorta: The ascending aorta was not well visualized and the aortic root is normal in size and structure. Venous: The inferior vena cava is normal in size with greater than 50% respiratory variability, suggesting right atrial pressure of 3 mmHg. IAS/Shunts: No atrial level shunt detected by color flow Doppler. Additional Comments: 3D was performed not requiring image post processing on an independent workstation and was indeterminate.  LEFT VENTRICLE PLAX 2D LVOT diam:     1.90 cm   Diastology LV SV:         48        LV e' medial:    5.44 cm/s LV SV Index:   24        LV E/e' medial:  9.5 LVOT Area:     2.84 cm  LV e' lateral:   5.98 cm/s                          LV E/e' lateral: 8.6  RIGHT VENTRICLE RV S prime:     11.60 cm/s TAPSE (M-mode): 2.2 cm LEFT ATRIUM           Index        RIGHT ATRIUM           Index LA Vol (A2C): 24.3 ml 11.93 ml/m  RA Area:     12.10 cm LA Vol (A4C): 41.0 ml 20.13 ml/m  RA Volume:   25.70 ml  12.62 ml/m  AORTIC VALVE LVOT Vmax:    96.95 cm/s LVOT Vmean:  66.450 cm/s LVOT VTI:    0.170 m MITRAL VALVE MV Area (PHT): 2.45 cm    SHUNTS MV Decel Time: 310 msec    Systemic VTI:  0.17 m MV E velocity: 51.50 cm/s  Systemic Diam: 1.90  cm MV A velocity: 70.90 cm/s MV E/A ratio:  0.73 Janelle Mediate MD Electronically signed by Janelle Mediate MD Signature Date/Time: 01/09/2024/12:54:59 PM    Final     Vitals:   01/10/24 0400 01/10/24 0500 01/10/24 0600 01/10/24 0700  BP: 130/77 130/81 133/77 (!) 149/87  Pulse: 76 78 73 80  Resp: 14 14 14 14   Temp: 98.9 F (37.2 C)     TempSrc: Oral     SpO2: 100% 99% 99% 99%  Weight:      Height:         PHYSICAL EXAM General:  Alert, well-nourished, well-developed patient in no acute distress Psych:  Mood and affect appropriate for situation CV: Regular rate and rhythm on monitor Respiratory:  Regular, unlabored respirations on room air   NEURO:  Mental Status: AA&Ox3, patient is able to give clear and coherent history Speech/Language: speech is without dysarthria or aphasia.    Cranial Nerves:  II: PERRL. Visual fields full.  III, IV, VI: EOMI. Eyelids elevate symmetrically.  V: Sensation is intact to light touch and symmetrical to face.  VII: Face is symmetrical resting and smiling VIII: hearing intact to voice. IX, X: Phonation is normal.  ZO:[id number] shrug 5/5. XII: tongue is midline without fasciculations. Motor: 5/5 strength to right upper and lower extremities, 4+/5 strength to left upper and lower extremities Tone: is normal and bulk is normal Sensation- Intact to light touch bilaterally but reduced on left arm/leg. Coordination: FTN intact bilaterally Gait- deferred  Most Recent NIH  1a Level of Conscious.: 0 1b LOC Questions: 0 1c LOC Commands: 0 2 Best Gaze: 0 3 Visual: 0 4 Facial Palsy: 0 5a Motor Arm - left: 1 5b Motor Arm - Right: 0 6a Motor Leg - Left: 2 6b Motor Leg - Right: 0 7 Limb Ataxia: 0 8 Sensory: 0 9 Best Language: 0 10 Dysarthria: 0 11  Extinct. and Inatten.: 0 TOTAL: 3   ASSESSMENT/PLAN  Mr. Cody Bautista is a 73 y.o. adult with history of hypertension and alcoholism admitted for acute onset left-sided weakness.  He was found to have a right thalamic ICH on head CT and has been admitted to the ICU.  He reports that he drinks a fifth of liquor daily and has been placed on CIWA protocol.  NIH on Admission 4  Intracerebral Hemorrhage:  right thalamic ICH Etiology: Likely hypertensive CT head focal hemorrhage in the right thalamus Repeat head CT 5/9 unchanged acute hemorrhage within right thalamus, generalized atrophy MRI pending MRA head and neck pending LDL 137 HgbA1c 4.3 VTE prophylaxis -SCDs No antithrombotic prior to admission, now on No antithrombotic secondary to ICH Therapy recommendations:  Pending Disposition: Pending  Hypertension Home meds: None Stable off nicardipine  As needed labetalol and hydralazine And amlodipine 5 mg daily increased to 10mg  today. Con't to monitor. Goal BP <180   Hyperlipidemia Home meds: None LDL  goal < 70 Add in at discharge if LDL above goal  Risk for diabetes type II  Home meds: None HgbA1c, goal < 7.0  Alcohol abuse Patient reports that he drinks a fifth of liquor daily Begin CIWA protocol with lorazepam Advise cessation of alcohol consumption  Other Stroke Risk Factors Obesity, Body mass index is 30.17 kg/m., BMI >/= 30 associated with increased stroke risk, recommend weight loss, diet and exercise as appropriate    Other Active Problems Gout-continue home allopurinol   Hospital day # 1  Try to get MRI/A today and transfer out of ICU. Increase norvasc.  Monitor BP.    Discussed with ICU nurse.    This patient is critically ill due to respiratory distress, ICH on CIWA protocol for ETOh use  and at significant risk of neurological worsening, death form heart failure, respiratory failure, recurrent stroke, bleeding from [facility], seizure, sepsis. This patient's  care requires constant monitoring of vital signs, hemodynamics, respiratory and cardiac monitoring, review of multiple databases, neurological assessment, discussion with family, other specialists and medical decision making of high complexity. I spent 30 minutes of neurocritical care time in the care of this patient.   Nick Stults,MD   To contact Stroke Continuity provider, please refer to [email or web address]. After hours, contact General Neurology

## 2024-01-10 NOTE — Evaluation (Signed)
 Physical Therapy Evaluation Patient Details Name: Cody Bautista MRN: [id number] DOB: 09/23/1950 Today's Date: 01/10/2024  History of Present Illness  Pt is a 73 y.o. male presenting 5/8 after fall at home. Endorsing ETOH use the night before.  CTH with R thalamic hemorrhage. PMH significant for HTN, gout, BPH.  Clinical Impression  Pt in bed upon arrival of PT, agreeable to evaluation at this time. Prior to admission the pt was independent, living with a roommate and reports no use of DME or recent falls. The pt now presents with limitations in functional mobility, L sided strength, problem solving, and coordination due to above dx, and will continue to benefit from skilled PT to address these deficits. The pt needed minA of 2 to complete sit-stand and up to modA of 2 to complete short bout of walking due to frequent buckling of L knee and difficulty with RW management and coordination of stepping. Recommend continued therapies <3hours/day until more independent to return home with intermittent assist.       If plan is discharge home, recommend the following: A lot of help with walking and/or transfers;A lot of help with bathing/dressing/bathroom;Assistance with cooking/housework;Assistance with feeding;Direct supervision/assist for medications management;Direct supervision/assist for financial management;Assist for transportation;Help with stairs or ramp for entrance;Supervision due to cognitive status   Can travel by private vehicle   Yes    Equipment Recommendations Wheelchair cushion (measurements PT);Wheelchair (measurements PT);Rolling walker (2 wheels);BSC/3in1  Recommendations for Other Services       Functional Status Assessment Patient has had a recent decline in their functional status and demonstrates the ability to make significant improvements in function in a reasonable and predictable amount of time.     Precautions / Restrictions Precautions Precautions: Fall Recall of  Precautions/Restrictions: Impaired Restrictions Weight Bearing Restrictions Per Provider Order: No      Mobility  Bed Mobility Overal bed mobility: Needs Assistance Bed Mobility: Supine to Sit, Sit to Supine     Supine to sit: Min assist Sit to supine: Min assist   General bed mobility comments: min A for scooting toward EOB, slowed speed and increased time for problem solving. Pt needing min A to bring LLE back into bed    Transfers Overall transfer level: Needs assistance Equipment used: Rolling walker (2 wheels) Transfers: Sit to/from Stand, Bed to chair/wheelchair/BSC Sit to Stand: Mod assist, +2 physical assistance, +2 safety/equipment   Step pivot transfers: Mod assist, +2 physical assistance, +2 safety/equipment       General transfer comment: Mod A for rise up from EOB and BSC with cues for hand placement. Pt needing mod A as well for SPT to BSC secondary to LLE buckling and need for dense cueing to use BUE to facilitate steps with RW and for safety with RW. Progressing to min A +2 back to bed with pivotal steps toward R with decr LLE buckling and improved RW placement    Ambulation/Gait Ambulation/Gait assistance: Mod assist, +2 physical assistance, +2 safety/equipment Gait Distance (Feet): 5 Feet (+ 5) Assistive device: Rolling walker (2 wheels) Gait Pattern/deviations: Step-to pattern, Decreased step length - right, Decreased stance time - left, Decreased weight shift to left, Knee flexed in stance - left, Knees buckling, Narrow base of support Gait velocity: decreased Gait velocity interpretation: <1.31 ft/sec, indicative of household ambulator   General Gait Details: pt dependent on assist of UE and intermittent assist to L knee to prevent buckling. mod cues for advancement of steps and positioning of RW  Modified Rankin (Stroke Patients  Only) Modified Rankin (Stroke Patients Only) Pre-Morbid Rankin Score: No significant disability Modified Rankin: Moderately  severe disability     Balance Overall balance assessment: Needs assistance Sitting-balance support: No upper extremity supported, Feet supported Sitting balance-Leahy Scale: Fair     Standing balance support: Bilateral upper extremity supported, During functional activity, Reliant on assistive device for balance Standing balance-Leahy Scale: Poor Standing balance comment: reliant on RW and external assist                             Pertinent Vitals/Pain Pain Assessment Pain Assessment: Faces Faces Pain Scale: Hurts little more Pain Location: L knee gout pain Pain Descriptors / Indicators: Aching, Guarding Pain Intervention(s): Limited activity within patient's tolerance, Monitored during session, Repositioned    Home Living Family/patient expects to be discharged to:: Private residence Living Arrangements: Other (Comment) (friend) Available Help at Discharge: Friend(s) Type of Home: House Home Access: Ramped entrance       Home Layout: One level Home Equipment: None      Prior Function Prior Level of Function : Independent/Modified Independent             Mobility Comments: pt reports no equipment ADLs Comments: Pt reports being independent in ADL and that he was doing the cooking     Extremity/Trunk Assessment   Upper Extremity Assessment Upper Extremity Assessment: Defer to OT evaluation RUE Deficits / Details: RUE with 3+/5 grip strength, 4+/5 push/pull . tremor with self feeding. decr finger to nose RUE Coordination: decreased fine motor LUE Deficits / Details: LUE with 4/5 grip strength, decr finger to nose, decr awareness of LUE with mobility needing cues for placement and proprioception AROM shoulder 0-120. Uses functionally with increased time and attention. Tremor present but less significant than R LUE Coordination: decreased fine motor;decreased gross motor    Lower Extremity Assessment Lower Extremity Assessment: LLE  deficits/detail LLE Deficits / Details: limited by L knee gout pain, unable to achieve knee extension, limited to grossly 3+/5 to MMT, decreased strength at hip LLE: Unable to fully assess due to pain LLE Sensation: WNL LLE Coordination: decreased gross motor;decreased fine motor    Cervical / Trunk Assessment Cervical / Trunk Assessment: Normal  Communication   Communication Communication: No apparent difficulties    Cognition Arousal: Alert Behavior During Therapy: WFL for tasks assessed/performed   PT - Cognitive impairments: No family/caregiver present to determine baseline, Awareness, Attention, Problem solving, Safety/Judgement                       PT - Cognition Comments: pt following commands but not able to verbalize needs or identify errors without cues Following commands: Impaired Following commands impaired: Follows one step commands with increased time, Follows multi-step commands inconsistently     Cueing Cueing Techniques: Verbal cues, Tactile cues     General Comments General comments (skin integrity, edema, etc.): VSS    Exercises     Assessment/Plan    PT Assessment Patient needs continued PT services  PT Problem List Decreased strength;Decreased range of motion;Decreased activity tolerance;Decreased balance;Decreased mobility;Decreased coordination;Decreased cognition;Decreased safety awareness;Pain       PT Treatment Interventions DME instruction;Gait training;Stair training;Functional mobility training;Therapeutic activities;Therapeutic exercise;Balance training;Patient/family education;Neuromuscular re-education    PT Goals (Current goals can be found in the Care Plan section)  Acute Rehab PT Goals Patient Stated Goal: to improve strength PT Goal Formulation: With patient Time For Goal Achievement: 01/24/24 Potential  to Achieve Goals: Fair    Frequency Min 3X/week        AM-PAC PT "6 Clicks" Mobility  Outcome Measure Help needed  turning from your back to your side while in a flat bed without using bedrails?: A Little Help needed moving from lying on your back to sitting on the side of a flat bed without using bedrails?: A Little Help needed moving to and from a bed to a chair (including a wheelchair)?: A Lot Help needed standing up from a chair using your arms (e.g., wheelchair or bedside chair)?: A Lot Help needed to walk in hospital room?: A Lot Help needed climbing 3-5 steps with a railing? 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

## 2024-01-10 NOTE — Progress Notes (Signed)
 May travel to MRI without nurse and without telemetry per Cortney Bucky Cardinal, NP

## 2024-01-11 DIAGNOSIS — R29703 NIHSS score 3: Secondary | ICD-10-CM | POA: Diagnosis not present

## 2024-01-11 DIAGNOSIS — I10 Essential (primary) hypertension: Secondary | ICD-10-CM | POA: Diagnosis not present

## 2024-01-11 DIAGNOSIS — E785 Hyperlipidemia, unspecified: Secondary | ICD-10-CM | POA: Diagnosis not present

## 2024-01-11 DIAGNOSIS — I619 Nontraumatic intracerebral hemorrhage, unspecified: Secondary | ICD-10-CM | POA: Diagnosis not present

## 2024-01-11 LAB — BASIC METABOLIC PANEL WITH GFR
Anion gap: 9 (ref 5–15)
BUN: 13 mg/dL (ref 8–23)
CO2: 21 mmol/L — ABNORMAL LOW (ref 22–32)
Calcium: 9 mg/dL (ref 8.9–10.3)
Chloride: 104 mmol/L (ref 98–111)
Creatinine, Ser: 1.22 mg/dL (ref 0.61–1.24)
GFR, Estimated: >60 mL/min (ref >60)
Glucose, Bld: 117 mg/dL — ABNORMAL HIGH (ref 70–99)
Potassium: 4.1 mmol/L (ref 3.5–5.1)
Sodium: 134 mmol/L — ABNORMAL LOW (ref 135–145)

## 2024-01-11 LAB — CBC
HCT: 31.8 % — ABNORMAL LOW (ref 39.0–52.0)
Hemoglobin: 11 g/dL — ABNORMAL LOW (ref 13.0–17.0)
MCH: 34.7 pg — ABNORMAL HIGH (ref 26.0–34.0)
MCHC: 34.6 g/dL (ref 30.0–36.0)
MCV: 100.3 fL — ABNORMAL HIGH (ref 80.0–100.0)
Platelets: 263 10*3/uL (ref 150–400)
RBC: 3.17 MIL/uL — ABNORMAL LOW (ref 4.22–5.81)
RDW: 12.1 % (ref 11.5–15.5)
WBC: 12.7 10*3/uL — ABNORMAL HIGH (ref 4.0–10.5)
nRBC: 0 % (ref 0.0–0.2)

## 2024-01-11 LAB — GLUCOSE, CAPILLARY: Glucose-Capillary: 146 mg/dL — ABNORMAL HIGH (ref 70–99)

## 2024-01-11 MED ORDER — ROSUVASTATIN CALCIUM 5 MG PO TABS
5.0000 mg | ORAL_TABLET | Freq: Every day | ORAL | Status: DC
  Administered 2024-01-11: 5 mg via ORAL
  Filled 2024-01-11: qty 1

## 2024-01-11 MED ORDER — TRAMADOL HCL 50 MG PO TABS
50.0000 mg | ORAL_TABLET | Freq: Once | ORAL | Status: AC
  Administered 2024-01-11: 50 mg via ORAL
  Filled 2024-01-11: qty 1

## 2024-01-11 NOTE — Plan of Care (Signed)
  Problem: Nutrition: Goal: Risk of aspiration will decrease Outcome: Progressing Goal: Dietary intake will improve Outcome: Progressing   Problem: Clinical Measurements: Goal: Ability to maintain clinical measurements within normal limits will improve Outcome: Progressing Goal: Will remain free from infection Outcome: Progressing Goal: Diagnostic test results will improve Outcome: Progressing Goal: Respiratory complications will improve Outcome: Progressing Goal: Cardiovascular complication will be avoided Outcome: Progressing   Problem: Coping: Goal: Level of anxiety will decrease Outcome: Progressing   Problem: Elimination: Goal: Will not experience complications related to bowel motility Outcome: Progressing Goal: Will not experience complications related to urinary retention Outcome: Progressing   Problem: Skin Integrity: Goal: Risk for impaired skin integrity will decrease Outcome: Progressing   Problem: Safety: Goal: Ability to remain free from injury will improve Outcome: Progressing

## 2024-01-11 NOTE — Plan of Care (Signed)
  Problem: Education: Goal: Knowledge of disease or condition will improve Outcome: Progressing   Problem: Self-Care: Goal: Ability to participate in self-care as condition permits will improve Outcome: Progressing   Problem: Health Behavior/Discharge Planning: Goal: Ability to manage health-related needs will improve Outcome: Progressing

## 2024-01-11 NOTE — Progress Notes (Signed)
 STROKE TEAM PROGRESS NOTE    SIGNIFICANT HOSPITAL EVENTS 5/8: Patient admitted with right thalamic ICH  INTERIM HISTORY/SUBJECTIVE Patient presented with left-sided weakness with CT scan showing small right thalamic hemorrhage without significant cytotoxic edema, midline shift or mass effect or hydrocephalus.    Transferred out of ICU yesterday. Hospitalist team to take over.   Doing well eating breakfast.   on CIWA protocol-received ativan this am.    OBJECTIVE  CBC    Component Value Date/Time   WBC 12.7 (H) 01/11/2024 0715   RBC 3.17 (L) 01/11/2024 0715   HGB 11.0 (L) 01/11/2024 0715   HCT 31.8 (L) 01/11/2024 0715   PLT 263 01/11/2024 0715   MCV 100.3 (H) 01/11/2024 0715   MCV 91.4 12/01/2013 1239   MCH 34.7 (H) 01/11/2024 0715   MCHC 34.6 01/11/2024 0715   RDW 12.1 01/11/2024 0715   LYMPHSABS 2.7 01/08/2024 2229   MONOABS 0.8 01/08/2024 2229   EOSABS 0.1 01/08/2024 2229   BASOSABS 0.1 01/08/2024 2229    BMET    Component Value Date/Time   NA 134 (L) 01/11/2024 0715   K 4.1 01/11/2024 0715   CL 104 01/11/2024 0715   CO2 21 (L) 01/11/2024 0715   GLUCOSE 117 (H) 01/11/2024 0715   BUN 13 01/11/2024 0715   CREATININE 1.22 01/11/2024 0715   CREATININE 1.15 12/01/2013 1216   CALCIUM 9.0 01/11/2024 0715   GFRNONAA >60 01/11/2024 0715    IMAGING past 24 hours MR BRAIN WO CONTRAST Result Date: 01/10/2024 CLINICAL DATA:  Follow-up examination for hemorrhagic stroke. EXAM: MRI HEAD WITHOUT CONTRAST MRA HEAD WITHOUT CONTRAST MRA NECK WITHOUT CONTRAST TECHNIQUE: Multiplanar, multiecho pulse sequences of the brain and surrounding structures were obtained without intravenous contrast. Angiographic images of the Circle of Willis were obtained using MRA technique without intravenous contrast. Angiographic images of the neck were obtained using MRA technique without intravenous contrast. Carotid stenosis measurements (when applicable) are obtained utilizing NASCET criteria,  using the distal internal carotid diameter as the denominator. COMPARISON:  CT from 01/09/2024. FINDINGS: MRI HEAD FINDINGS Brain: Diffuse prominence of the CSF containing spaces compatible generalized cerebral atrophy. Patchy T2/FLAIR hyperintensity involving the periventricular and deep white matter, consistent with chronic small vessel ischemic disease, mild in nature. Previously identified acute intraparenchymal hemorrhage centered at the right thalamocapsular region again seen, not significantly changed in size or morphology measuring up to 1.5 cm. Localized vasogenic edema without significant regional mass effect. No intraventricular extension. No visible underlying lesion or other structural abnormality on this noncontrast MRI. No other evidence for acute or subacute infarct. No other acute or chronic intracranial blood products. No mass lesion, midline shift or mass effect. No hydrocephalus or extra-axial fluid collection. Pituitary gland and suprasellar region within normal limits. Vascular: Major intracranial vascular flow voids are maintained. Skull and upper cervical spine: Craniocervical junction within normal limits. There is question of a 1.5 cm T1 hypointense lesion involving the C4 vertebral body (series 1, image 16). No visible lesion to correspond this finding seen on prior CT. Bone marrow signal intensity otherwise within normal limits. No scalp soft tissue abnormality. Sinuses/Orbits: Right gaze preference noted. Paranasal sinuses are largely clear. No significant mastoid effusion. Other: None. MRA HEAD FINDINGS ANTERIOR CIRCULATION: Examination mildly degraded by motion artifact. Both internal carotid arteries are patent through the siphons without stenosis or other abnormality. A1 segments, anterior communicating artery complex, and anterior cerebral arteries widely patent. No M1 stenosis or occlusion. Distal MCA branches perfused and symmetric. POSTERIOR CIRCULATION: Visualized  V4 segments  widely patent, with the left being dominant. Partially visualized PICA patent. Basilar patent without stenosis. Superior cerebral arteries patent bilaterally. Both PCAs primarily supplied via the basilar. PCAs patent to their distal aspects without stenosis. Small right posterior communicating artery noted. MRA NECK FINDINGS AORTIC ARCH: Partially visualized aortic arch within normal limits for caliber with standard 3 vessel morphology. No visible stenosis or other abnormality about the origin the great vessels. RIGHT CAROTID SYSTEM: Right common and internal carotid arteries are patent with antegrade flow. No evidence for dissection. No hemodynamically significant stenosis about the right carotid artery system. LEFT CAROTID SYSTEM: Left common and internal carotid arteries are patent with antegrade flow. No evidence for dissection. No hemodynamically significant stenosis about the left carotid artery system. VERTEBRAL ARTERIES: Both vertebral arteries arise from the subclavian arteries. No visible proximal subclavian artery stenosis. Left vertebral artery dominant. Vertebral arteries are patent with antegrade flow. No evidence for dissection or stenosis. Other: None. IMPRESSION: MRI HEAD: 1. No significant interval change in size and morphology of acute intraparenchymal hemorrhage centered at the right thalamocapsular region. Localized vasogenic edema without significant regional mass effect. No intraventricular extension. No visible underlying structural abnormality. 2. No other acute intracranial abnormality. 3. Underlying age-related cerebral atrophy with mild chronic small vessel ischemic disease. 4. Question 1.5 cm T1 hypointense lesion involving the C4 vertebral body. No visible lesion to correspond with this finding seen on prior CT. Finding is indeterminate, and could be related underlying spondylosis, although correlation with dedicated MRI of the cervical spine, with and without contrast, suggested for  further evaluation. MRA HEAD: Negative intracranial MRA for large vessel occlusion or other emergent finding. No hemodynamically significant or correctable stenosis. MRA NECK: Negative MRA of the neck. No hemodynamically significant stenosis or other acute vascular abnormality. Electronically Signed   By: Virgia Griffins M.D.   On: 01/10/2024 18:57   MR ANGIO HEAD WO CONTRAST Result Date: 01/10/2024 CLINICAL DATA:  Follow-up examination for hemorrhagic stroke. EXAM: MRI HEAD WITHOUT CONTRAST MRA HEAD WITHOUT CONTRAST MRA NECK WITHOUT CONTRAST TECHNIQUE: Multiplanar, multiecho pulse sequences of the brain and surrounding structures were obtained without intravenous contrast. Angiographic images of the Circle of Willis were obtained using MRA technique without intravenous contrast. Angiographic images of the neck were obtained using MRA technique without intravenous contrast. Carotid stenosis measurements (when applicable) are obtained utilizing NASCET criteria, using the distal internal carotid diameter as the denominator. COMPARISON:  CT from 01/09/2024. FINDINGS: MRI HEAD FINDINGS Brain: Diffuse prominence of the CSF containing spaces compatible generalized cerebral atrophy. Patchy T2/FLAIR hyperintensity involving the periventricular and deep white matter, consistent with chronic small vessel ischemic disease, mild in nature. Previously identified acute intraparenchymal hemorrhage centered at the right thalamocapsular region again seen, not significantly changed in size or morphology measuring up to 1.5 cm. Localized vasogenic edema without significant regional mass effect. No intraventricular extension. No visible underlying lesion or other structural abnormality on this noncontrast MRI. No other evidence for acute or subacute infarct. No other acute or chronic intracranial blood products. No mass lesion, midline shift or mass effect. No hydrocephalus or extra-axial fluid collection. Pituitary gland and  suprasellar region within normal limits. Vascular: Major intracranial vascular flow voids are maintained. Skull and upper cervical spine: Craniocervical junction within normal limits. There is question of a 1.5 cm T1 hypointense lesion involving the C4 vertebral body (series 1, image 16). No visible lesion to correspond this finding seen on prior CT. Bone marrow signal intensity otherwise within normal limits.  No scalp soft tissue abnormality. Sinuses/Orbits: Right gaze preference noted. Paranasal sinuses are largely clear. No significant mastoid effusion. Other: None. MRA HEAD FINDINGS ANTERIOR CIRCULATION: Examination mildly degraded by motion artifact. Both internal carotid arteries are patent through the siphons without stenosis or other abnormality. A1 segments, anterior communicating artery complex, and anterior cerebral arteries widely patent. No M1 stenosis or occlusion. Distal MCA branches perfused and symmetric. POSTERIOR CIRCULATION: Visualized V4 segments widely patent, with the left being dominant. Partially visualized PICA patent. Basilar patent without stenosis. Superior cerebral arteries patent bilaterally. Both PCAs primarily supplied via the basilar. PCAs patent to their distal aspects without stenosis. Small right posterior communicating artery noted. MRA NECK FINDINGS AORTIC ARCH: Partially visualized aortic arch within normal limits for caliber with standard 3 vessel morphology. No visible stenosis or other abnormality about the origin the great vessels. RIGHT CAROTID SYSTEM: Right common and internal carotid arteries are patent with antegrade flow. No evidence for dissection. No hemodynamically significant stenosis about the right carotid artery system. LEFT CAROTID SYSTEM: Left common and internal carotid arteries are patent with antegrade flow. No evidence for dissection. No hemodynamically significant stenosis about the left carotid artery system. VERTEBRAL ARTERIES: Both vertebral arteries  arise from the subclavian arteries. No visible proximal subclavian artery stenosis. Left vertebral artery dominant. Vertebral arteries are patent with antegrade flow. No evidence for dissection or stenosis. Other: None. IMPRESSION: MRI HEAD: 1. No significant interval change in size and morphology of acute intraparenchymal hemorrhage centered at the right thalamocapsular region. Localized vasogenic edema without significant regional mass effect. No intraventricular extension. No visible underlying structural abnormality. 2. No other acute intracranial abnormality. 3. Underlying age-related cerebral atrophy with mild chronic small vessel ischemic disease. 4. Question 1.5 cm T1 hypointense lesion involving the C4 vertebral body. No visible lesion to correspond with this finding seen on prior CT. Finding is indeterminate, and could be related underlying spondylosis, although correlation with dedicated MRI of the cervical spine, with and without contrast, suggested for further evaluation. MRA HEAD: Negative intracranial MRA for large vessel occlusion or other emergent finding. No hemodynamically significant or correctable stenosis. MRA NECK: Negative MRA of the neck. No hemodynamically significant stenosis or other acute vascular abnormality. Electronically Signed   By: Virgia Griffins M.D.   On: 01/10/2024 18:57   MR ANGIO NECK WO CONTRAST Result Date: 01/10/2024 CLINICAL DATA:  Follow-up examination for hemorrhagic stroke. EXAM: MRI HEAD WITHOUT CONTRAST MRA HEAD WITHOUT CONTRAST MRA NECK WITHOUT CONTRAST TECHNIQUE: Multiplanar, multiecho pulse sequences of the brain and surrounding structures were obtained without intravenous contrast. Angiographic images of the Circle of Willis were obtained using MRA technique without intravenous contrast. Angiographic images of the neck were obtained using MRA technique without intravenous contrast. Carotid stenosis measurements (when applicable) are obtained utilizing  NASCET criteria, using the distal internal carotid diameter as the denominator. COMPARISON:  CT from 01/09/2024. FINDINGS: MRI HEAD FINDINGS Brain: Diffuse prominence of the CSF containing spaces compatible generalized cerebral atrophy. Patchy T2/FLAIR hyperintensity involving the periventricular and deep white matter, consistent with chronic small vessel ischemic disease, mild in nature. Previously identified acute intraparenchymal hemorrhage centered at the right thalamocapsular region again seen, not significantly changed in size or morphology measuring up to 1.5 cm. Localized vasogenic edema without significant regional mass effect. No intraventricular extension. No visible underlying lesion or other structural abnormality on this noncontrast MRI. No other evidence for acute or subacute infarct. No other acute or chronic intracranial blood products. No mass lesion, midline shift or mass  effect. No hydrocephalus or extra-axial fluid collection. Pituitary gland and suprasellar region within normal limits. Vascular: Major intracranial vascular flow voids are maintained. Skull and upper cervical spine: Craniocervical junction within normal limits. There is question of a 1.5 cm T1 hypointense lesion involving the C4 vertebral body (series 1, image 16). No visible lesion to correspond this finding seen on prior CT. Bone marrow signal intensity otherwise within normal limits. No scalp soft tissue abnormality. Sinuses/Orbits: Right gaze preference noted. Paranasal sinuses are largely clear. No significant mastoid effusion. Other: None. MRA HEAD FINDINGS ANTERIOR CIRCULATION: Examination mildly degraded by motion artifact. Both internal carotid arteries are patent through the siphons without stenosis or other abnormality. A1 segments, anterior communicating artery complex, and anterior cerebral arteries widely patent. No M1 stenosis or occlusion. Distal MCA branches perfused and symmetric. POSTERIOR CIRCULATION:  Visualized V4 segments widely patent, with the left being dominant. Partially visualized PICA patent. Basilar patent without stenosis. Superior cerebral arteries patent bilaterally. Both PCAs primarily supplied via the basilar. PCAs patent to their distal aspects without stenosis. Small right posterior communicating artery noted. MRA NECK FINDINGS AORTIC ARCH: Partially visualized aortic arch within normal limits for caliber with standard 3 vessel morphology. No visible stenosis or other abnormality about the origin the great vessels. RIGHT CAROTID SYSTEM: Right common and internal carotid arteries are patent with antegrade flow. No evidence for dissection. No hemodynamically significant stenosis about the right carotid artery system. LEFT CAROTID SYSTEM: Left common and internal carotid arteries are patent with antegrade flow. No evidence for dissection. No hemodynamically significant stenosis about the left carotid artery system. VERTEBRAL ARTERIES: Both vertebral arteries arise from the subclavian arteries. No visible proximal subclavian artery stenosis. Left vertebral artery dominant. Vertebral arteries are patent with antegrade flow. No evidence for dissection or stenosis. Other: None. IMPRESSION: MRI HEAD: 1. No significant interval change in size and morphology of acute intraparenchymal hemorrhage centered at the right thalamocapsular region. Localized vasogenic edema without significant regional mass effect. No intraventricular extension. No visible underlying structural abnormality. 2. No other acute intracranial abnormality. 3. Underlying age-related cerebral atrophy with mild chronic small vessel ischemic disease. 4. Question 1.5 cm T1 hypointense lesion involving the C4 vertebral body. No visible lesion to correspond with this finding seen on prior CT. Finding is indeterminate, and could be related underlying spondylosis, although correlation with dedicated MRI of the cervical spine, with and without  contrast, suggested for further evaluation. MRA HEAD: Negative intracranial MRA for large vessel occlusion or other emergent finding. No hemodynamically significant or correctable stenosis. MRA NECK: Negative MRA of the neck. No hemodynamically significant stenosis or other acute vascular abnormality. Electronically Signed   By: Virgia Griffins M.D.   On: 01/10/2024 18:57    Vitals:   01/11/24 0034 01/11/24 0037 01/11/24 0516 01/11/24 0800  BP: (!) 141/64 (!) 149/67 (!) 150/87 (!) 154/80  Pulse: (!) 104 (!) 102 96 90  Resp: 17 18  19   Temp:   98.3 F (36.8 C) 98.3 F (36.8 C)  TempSrc:   Axillary Oral  SpO2:  99% 100% 100%  Weight:      Height:         PHYSICAL EXAM General:  Alert, well-nourished, well-developed patient in no acute distress Psych:  Mood and affect appropriate for situation CV: Regular rate and rhythm on monitor Respiratory:  Regular, unlabored respirations on room air   NEURO:  Mental Status: AA&Ox3, patient is able to give clear and coherent history Speech/Language: speech is without dysarthria or  aphasia.    Cranial Nerves:  II: PERRL. Visual fields full.  III, IV, VI: EOMI. Eyelids elevate symmetrically.  V: Sensation is intact to light touch and symmetrical to face.  VII: Face is symmetrical resting and smiling VIII: hearing intact to voice. IX, X: Phonation is normal.   Motor: 5/5 strength to right upper and lower extremities, 4+/5 strength to left upper and lower extremities Tone: is normal and bulk is normal Sensation- Intact to light touch bilaterally but reduced on left arm/leg. Coordination: FTN intact bilaterally Gait- deferred  Most Recent NIH  1a Level of Conscious.: 0 1b LOC Questions: 0 1c LOC Commands: 0 2 Best Gaze: 0 3 Visual: 0 4 Facial Palsy: 0 5a Motor Arm - left: 1 5b Motor Arm - Right: 0 6a Motor Leg - Left: 2 6b Motor Leg - Right: 0 7 Limb Ataxia: 0 8 Sensory: 0 9 Best Language: 0 10 Dysarthria: 0 11 Extinct. and  Inatten.: 0 TOTAL: 3   ASSESSMENT/PLAN  Mr. Cody Bautista is a 73 y.o. adult with history of hypertension and alcoholism admitted for acute onset left-sided weakness.  He was found to have a right thalamic ICH on head CT and has been admitted to the ICU.  He reports that he drinks a fifth of liquor daily and has been placed on CIWA protocol.  NIH on Admission 4  Intracerebral Hemorrhage:  right thalamic ICH Etiology: Likely hypertensive CT head focal hemorrhage in the right thalamus Repeat head CT 5/9 unchanged acute hemorrhage within right thalamus, generalized atrophy MRI show ICH. Stable. MRA head and neck neg. LDL 137 HgbA1c 4.3 VTE prophylaxis -SCDs No antithrombotic prior to admission, now on No antithrombotic secondary to ICH Therapy recommendations:  Pending Disposition: Pending  Hypertension Home meds: None Stable off nicardipine  As needed labetalol and hydralazine And amlodipine 5 mg daily increased to 10mg  today. Con't to monitor. Goal BP <180   Hyperlipidemia Home meds: None LDL  goal < 70 Add LDL today crestor 5mg .  Risk for diabetes type II  Home meds: None HgbA1c, goal < 7.0  Alcohol abuse Patient reports that he drinks a fifth of liquor daily On CIWA protocol with lorazepam Advise cessation of alcohol consumption  Other Stroke Risk Factors Obesity, Body mass index is 30.17 kg/m., BMI >/= 30 associated with increased stroke risk, recommend weight loss, diet and exercise as appropriate    Other Active Problems Gout-continue home allopurinol   Hospital day # 2  CIR placement pending. On CIWA protocol. Crestor added.   Deaysia Grigoryan,MD   To contact Stroke Continuity provider, please refer to [email or web address]. After hours, contact General Neurology

## 2024-01-12 DIAGNOSIS — I610 Nontraumatic intracerebral hemorrhage in hemisphere, subcortical: Secondary | ICD-10-CM | POA: Diagnosis not present

## 2024-01-12 DIAGNOSIS — I10 Essential (primary) hypertension: Secondary | ICD-10-CM | POA: Diagnosis not present

## 2024-01-12 DIAGNOSIS — E785 Hyperlipidemia, unspecified: Secondary | ICD-10-CM | POA: Diagnosis not present

## 2024-01-12 DIAGNOSIS — I619 Nontraumatic intracerebral hemorrhage, unspecified: Secondary | ICD-10-CM | POA: Diagnosis not present

## 2024-01-12 DIAGNOSIS — R29703 NIHSS score 3: Secondary | ICD-10-CM | POA: Diagnosis not present

## 2024-01-12 LAB — CBC
HCT: 30.5 % — ABNORMAL LOW (ref 39.0–52.0)
Hemoglobin: 10.4 g/dL — ABNORMAL LOW (ref 13.0–17.0)
MCH: 34.2 pg — ABNORMAL HIGH (ref 26.0–34.0)
MCHC: 34.1 g/dL (ref 30.0–36.0)
MCV: 100.3 fL — ABNORMAL HIGH (ref 80.0–100.0)
Platelets: 258 10*3/uL (ref 150–400)
RBC: 3.04 MIL/uL — ABNORMAL LOW (ref 4.22–5.81)
RDW: 12 % (ref 11.5–15.5)
WBC: 12.8 10*3/uL — ABNORMAL HIGH (ref 4.0–10.5)
nRBC: 0 % (ref 0.0–0.2)

## 2024-01-12 LAB — BASIC METABOLIC PANEL WITH GFR
Anion gap: 9 (ref 5–15)
BUN: 15 mg/dL (ref 8–23)
CO2: 20 mmol/L — ABNORMAL LOW (ref 22–32)
Calcium: 9.2 mg/dL (ref 8.9–10.3)
Chloride: 101 mmol/L (ref 98–111)
Creatinine, Ser: 1.11 mg/dL (ref 0.61–1.24)
GFR, Estimated: >60 mL/min (ref >60)
Glucose, Bld: 97 mg/dL (ref 70–99)
Potassium: 4.1 mmol/L (ref 3.5–5.1)
Sodium: 130 mmol/L — ABNORMAL LOW (ref 135–145)

## 2024-01-12 MED ORDER — ROSUVASTATIN CALCIUM 20 MG PO TABS
20.0000 mg | ORAL_TABLET | Freq: Every day | ORAL | Status: DC
  Administered 2024-01-12 – 2024-01-13 (×2): 20 mg via ORAL
  Filled 2024-01-12 (×2): qty 1

## 2024-01-12 NOTE — Progress Notes (Signed)
 Physical Therapy Treatment Patient Details Name: Cody Bautista MRN: [id number] DOB: [birth year] Today's Date: 01/12/2024   History of Present Illness Pt is a 73 y.o. male presenting 5/8 after fall at home. Endorsing ETOH use the night before.  CTH with R thalamic hemorrhage. PMH significant for HTN, gout, BPH.    PT Comments  Patient is agreeable to PT session. He complains of L knee pain and swelling. Patient continues to have left side weakness today with assistance required for mobility efforts. Several standing bouts performed with Max A. Pre-gait activity performed with emphasis on upright standing posture. Unable to safely take steps at this time. Recommend to continue PT to maximize independence and decrease caregiver burden. Anticipate patient will need rehabilitation < 3 hours/day after this hospital stay.    If plan is discharge home, recommend the following: A lot of help with walking and/or transfers;A lot of help with bathing/dressing/bathroom;Assistance with cooking/housework;Assistance with feeding;Direct supervision/assist for medications management;Direct supervision/assist for financial management;Assist for transportation;Help with stairs or ramp for entrance;Supervision due to cognitive status   Can travel by private vehicle     Yes  Equipment Recommendations  Wheelchair cushion (measurements PT);Wheelchair (measurements PT);Rolling walker (2 wheels);BSC/3in1    Recommendations for Other Services       Precautions / Restrictions Precautions Precautions: Fall Recall of Precautions/Restrictions: Impaired Restrictions Weight Bearing Restrictions Per Provider Order: No     Mobility  Bed Mobility Overal bed mobility: Needs Assistance Bed Mobility: Supine to Sit, Sit to Supine     Supine to sit: Min assist Sit to supine: Min assist   General bed mobility comments: cues for sequencing. increased time required    Transfers Overall transfer level: Needs  assistance Equipment used: Rolling walker (2 wheels), None Transfers: Sit to/from Stand, Bed to chair/wheelchair/BSC Sit to Stand: Max assist          Lateral/Scoot Transfers: Min assist General transfer comment: standing performed from the bed with and without rolling walker. faciliation for hip extension on the left with cues anterior weight shifting. patient required hand over hand for guidance of LUE to the rolling walker. incremental lateral scooting performed to right with cues for technique.    Ambulation/Gait             Pre-gait activities: emphasis on upright standing posture, full hip extension. L knee is flexed while standing with knee buckling with weight bearing. unable to take steps safely at this time     Stairs             Wheelchair Mobility     Tilt Bed    Modified Rankin (Stroke Patients Only)       Balance Overall balance assessment: Needs assistance Sitting-balance support: Feet supported Sitting balance-Leahy Scale: Fair Sitting balance - Comments: occasional L lean with fatigue. Postural control: Left lateral lean Standing balance support: Single extremity supported Standing balance-Leahy Scale: Poor Standing balance comment: heavy external support required to maintain standing balance                            Communication Communication Communication: No apparent difficulties  Cognition Arousal: Alert Behavior During Therapy: WFL for tasks assessed/performed   PT - Cognitive impairments: No family/caregiver present to determine baseline, Problem solving, Safety/Judgement                       PT - Cognition Comments: patient perseverates on L knee swelling and pain  Following commands: Impaired Following commands impaired: Follows one step commands with increased time    Cueing Cueing Techniques: Verbal cues, Tactile cues  Exercises      General Comments        Pertinent Vitals/Pain Pain  Assessment Pain Assessment: Faces Faces Pain Scale: Hurts little more Pain Location: L knee Pain Descriptors / Indicators: Discomfort, Guarding Pain Intervention(s): Monitored during session, Limited activity within patient's tolerance, Repositioned    Home Living                          Prior Function            PT Goals (current goals can now be found in the care plan section) Acute Rehab PT Goals Patient Stated Goal: to improve strength PT Goal Formulation: With patient Time For Goal Achievement: 01/24/24 Potential to Achieve Goals: Fair Progress towards PT goals: Progressing toward goals    Frequency    Min 3X/week      PT Plan      Co-evaluation              AM-PAC PT "6 Clicks" Mobility   Outcome Measure  Help needed turning from your back to your side while in a flat bed without using bedrails?: A Little Help needed moving from lying on your back to sitting on the side of a flat bed without using bedrails?: A Little Help needed moving to and from a bed to a chair (including a wheelchair)?: A Lot Help needed standing up from a chair using your arms (e.g., wheelchair or bedside chair)?: A Lot Help needed to walk in hospital room?: A Lot Help needed climbing 3-5 steps with a railing? : Total 6 Click Score: 13    End of Session   Activity Tolerance: Patient tolerated treatment well Patient left: in bed;with call bell/phone within reach;with bed alarm set   PT Visit Diagnosis: Unsteadiness on feet (R26.81);Other abnormalities of gait and mobility (R26.89);Muscle weakness (generalized) (M62.81);Hemiplegia and hemiparesis Hemiplegia - Right/Left: Left     Time: 1610-9604 PT Time Calculation (min) (ACUTE ONLY): 16 min  Charges:    $Therapeutic Activity: 8-22 mins PT General Charges $$ ACUTE PT VISIT: 1 Visit                     Ozie Bo, PT, MPT    [name] 01/12/2024, 10:12 AM

## 2024-01-12 NOTE — Progress Notes (Addendum)
 STROKE TEAM PROGRESS NOTE    SIGNIFICANT HOSPITAL EVENTS 5/8: Patient admitted with right thalamic ICH  INTERIM HISTORY/SUBJECTIVE No family at the bedside.  Patient is sitting up in the bed in no apparent distress. No new neurological events overnight Labs and vitals remained stable   CBC    Component Value Date/Time   WBC 12.8 (H) 01/12/2024 0649   RBC 3.04 (L) 01/12/2024 0649   HGB 10.4 (L) 01/12/2024 0649   HCT 30.5 (L) 01/12/2024 0649   PLT 258 01/12/2024 0649   MCV 100.3 (H) 01/12/2024 0649   MCV 91.4 12/01/2013 1239   MCH 34.2 (H) 01/12/2024 0649   MCHC 34.1 01/12/2024 0649   RDW 12.0 01/12/2024 0649   LYMPHSABS 2.7 01/08/2024 2229   MONOABS 0.8 01/08/2024 2229   EOSABS 0.1 01/08/2024 2229   BASOSABS 0.1 01/08/2024 2229    BMET    Component Value Date/Time   NA 130 (L) 01/12/2024 0649   K 4.1 01/12/2024 0649   CL 101 01/12/2024 0649   CO2 20 (L) 01/12/2024 0649   GLUCOSE 97 01/12/2024 0649   BUN 15 01/12/2024 0649   CREATININE 1.11 01/12/2024 0649   CREATININE 1.15 12/01/2013 1216   CALCIUM 9.2 01/12/2024 0649   GFRNONAA >60 01/12/2024 0649    IMAGING past 24 hours No results found.   Vitals:   01/12/24 0000 01/12/24 0400 01/12/24 0806 01/12/24 1204  BP: (!) 141/79 (!) 161/89 136/83 (!) 152/77  Pulse: (!) 105 (!) 109 89 83  Resp: 18 18 18 18   Temp: 99 F (37.2 C) 98.8 F (37.1 C) 98.3 F (36.8 C) 98.5 F (36.9 C)  TempSrc: Oral Oral  Oral  SpO2: 97% 100% 98% 100%  Weight:      Height:         PHYSICAL EXAM General:  Alert, well-nourished, well-developed patient in no acute distress Psych:  Mood and affect appropriate for situation CV: Regular rate and rhythm on monitor Respiratory:  Regular, unlabored respirations on room air   NEURO:  Mental Status: AA&Ox3, patient is able to give clear and coherent history Speech/Language: speech is without dysarthria or aphasia.    Cranial Nerves:  II: PERRL. Visual fields full.  III, IV, VI:  EOMI. Eyelids elevate symmetrically.  V: Sensation is intact to light touch and symmetrical to face.  VII: Face is symmetrical resting and smiling VIII: hearing intact to voice. IX, X: Phonation is normal.   Motor: 5/5 strength to right upper and lower extremities, 4+/5 strength to left upper and lower extremities Tone: is normal and bulk is normal Sensation- Intact to light touch bilaterally but reduced on left arm/leg. Coordination: FTN intact bilaterally Gait- deferred  Most Recent NIH  1a Level of Conscious.: 0 1b LOC Questions: 0 1c LOC Commands: 0 2 Best Gaze: 0 3 Visual: 0 4 Facial Palsy: 0 5a Motor Arm - left: 1 5b Motor Arm - Right: 0 6a Motor Leg - Left: 2 6b Motor Leg - Right: 0 7 Limb Ataxia: 0 8 Sensory: 0 9 Best Language: 0 10 Dysarthria: 0 11 Extinct. and Inatten.: 0 TOTAL: 3   ASSESSMENT/PLAN  Mr. Melchior Rutstein is a 73 y.o. adult with history of hypertension and alcoholism admitted for acute onset left-sided weakness.  He was found to have a right thalamic ICH on head CT and has been admitted to the ICU.  He reports that he drinks a fifth of liquor daily and has been placed on CIWA protocol.  NIH on  Admission 4  ICH:  right thalamic ICH, etiology: Likely hypertensive CT head focal hemorrhage in the right thalamus Repeat head CT 5/9 unchanged acute hemorrhage within right thalamus, generalized atrophy MRI show ICH. Stable. MRA head and neck neg. LDL 137 HgbA1c 4.3 VTE prophylaxis -SCDs No antithrombotic prior to admission, now on No antithrombotic secondary to ICH Therapy recommendations:  SNF Disposition: Pending  Hypertension Home meds: None Stable off nicardipine  amlodipine 5 mg daily increased to 10mg  Goal BP <160  Long-term BP goal normotensive  Hyperlipidemia Home meds: None LDL 137 goal < 70 Add crestor 20mg  Continue statin on discharge  Alcohol abuse Patient reports that he drinks a fifth of liquor daily On CIWA protocol with  lorazepam Advise cessation of alcohol consumption  Other Stroke Risk Factors Obesity, Body mass index is 30.17 kg/m., BMI >/= 30 associated with increased stroke risk, recommend weight loss, diet and exercise as appropriate   Other Active Problems Gout-continue home allopurinol    Neurology will sign off.  Please call with questions or concerns Hospital day # 3   Jonette Nestle DNP, ACNPC-AG  [facility]  ATTENDING NOTE: I reviewed above note and agree with the assessment and plan. Pt was seen and examined.   No family at the bedside. Pt lying in bed, initially sleeping but easily arousable and able to maintain wakefulness. Orientated to place, time and age. No aphsia and following commands but with mild dysarthria. Poor denture. Able to name and repeat. No hemianopia, tracking bilaterally. Mild left facial droop. Tongue midline. No gaze palsy. LUE 4/5 but with ataxia out of proportion to the weakness. LLE 3/5, sensation symmetrical. Gait not tested.   For detailed assessment and plan, please refer to above as I have made changes wherever appropriate.   Neurology will sign off. Please call with questions. Pt will follow up with stroke clinic NP at [facility] in about 4 weeks. Thanks for the consult.   Consuelo Denmark, MD PhD Stroke Neurology 01/12/2024 2:20 PM    To contact Stroke Continuity provider, please refer to [email or web address]. After hours, contact General Neurology

## 2024-01-12 NOTE — TOC CAGE-AID Note (Signed)
 Transition of Care [facility]) - CAGE-AID Screening   Patient Details  Name: Cody Bautista MRN: [id number] Date of Birth: 1950-11-06  Transition of Care [facility]) CM/SW Contact:    Jonathan Neighbor, RN Phone Number: 01/12/2024, 3:20 PM   Clinical Narrative:  CM provided pt with inpatient/ outpatient alcohol counseling resources.   CAGE-AID Screening:    Have You Ever Felt You Ought to Cut Down on Your Drinking or Drug Use?: Yes Have People Annoyed You By Critizing Your Drinking Or Drug Use?: No Have You Felt Bad Or Guilty About Your Drinking Or Drug Use?: Yes Have You Ever Had a Drink or Used Drugs First Thing In The Morning to Steady Your Nerves or to Get Rid of a Hangover?: No CAGE-AID Score: 2  Substance Abuse Education Offered: Yes (provided)  Substance abuse interventions: Patient Counseling, Transport planner

## 2024-01-12 NOTE — NC FL2 (Signed)
 [place]  MEDICAID FL2 LEVEL OF CARE FORM     IDENTIFICATION  Patient Name: Cody Bautista Birthdate: 04-Nov-1950 Sex: adult Admission Date (Current Location): 01/08/2024  [facility] and [place] Number:  Producer, television/film/video and Address:  [facility] [place], 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   Benign prostatic hyperplasia 02/22/2012   Back pain, chronic 02/22/2012   Gout 10/15/2011   Elevated blood pressure 10/15/2011    Orientation RESPIRATION BLADDER Height & Weight     Self, Time, Situation, Place  Normal Incontinent Weight: 90 kg Height:  5\' 8"  (172.7 cm)  BEHAVIORAL SYMPTOMS/MOOD NEUROLOGICAL BOWEL NUTRITION STATUS      Continent Diet (Regular with thin liquids)  AMBULATORY STATUS COMMUNICATION OF NEEDS Skin   Extensive Assist Verbally  (dry skin)                       Personal Care Assistance Level of Assistance  Bathing, Feeding, Dressing Bathing Assistance: Limited assistance Feeding assistance: Limited assistance Dressing Assistance: Limited assistance     Functional Limitations Info  Sight, Hearing, Speech Sight Info: Impaired Hearing Info: Adequate Speech Info: Adequate    SPECIAL CARE FACTORS FREQUENCY  PT (By licensed PT), OT (By licensed OT)     PT Frequency: 5x/wk OT Frequency: 5x/wk            Contractures Contractures Info: Not present    Additional Factors Info  Code Status, Allergies Code Status Info: full Allergies  Info: NKA           Current Medications (01/12/2024):  This is the current hospital active medication list Current Facility-Administered Medications  Medication Dose Route Frequency Provider Last Rate Last Admin   acetaminophen  (TYLENOL ) tablet 650 mg  650 mg Oral Q4H PRN Augustin Leber, MD   650 mg at 01/12/24 0530   Or   acetaminophen  (TYLENOL ) 160 MG/5ML solution 650 mg  650 mg Per Tube Q4H PRN Augustin Leber, MD       Or   acetaminophen  (TYLENOL ) suppository 650 mg  650 mg Rectal Q4H PRN Augustin Leber, MD       allopurinol  (ZYLOPRIM ) tablet 300 mg  300 mg Oral Daily de Thayne Fine, Cortney E, NP   300 mg at 01/12/24 1055   amLODipine (NORVASC) tablet 10 mg  10 mg Oral Daily Palikh, Gaurang M, MD   10 mg at 01/12/24 1055   enoxaparin (LOVENOX) injection 40 mg  40 mg Subcutaneous Daily Sethi, Pramod S, MD   40 mg at 01/12/24 1056   folic acid (FOLVITE) tablet 1 mg  1 mg Oral Daily de La Torre, Cortney E, NP   1 mg at 01/12/24 1055   hydrALAZINE (APRESOLINE) injection 10 mg  10 mg Intravenous Q4H PRN de Thayne Fine, Cortney E, NP   10 mg at 01/11/24 1805   labetalol (NORMODYNE) injection 10 mg  10 mg Intravenous Q2H PRN de Thayne Fine, Cortney E,  NP       multivitamin with minerals tablet 1 tablet  1 tablet Oral Daily de Thayne Fine, Cortney E, NP   1 tablet at 01/12/24 1056   pantoprazole (PROTONIX) EC tablet 40 mg  40 mg Oral QHS Sethi, Pramod S, MD   40 mg at 01/11/24 2103   rosuvastatin (CRESTOR) tablet 20 mg  20 mg Oral Daily Consuelo Denmark, MD   20 mg at 01/12/24 1056   senna-docusate (Senokot-S) tablet 1 tablet  1 tablet Oral BID Augustin Leber, MD   1 tablet at 01/12/24 1055   thiamine (VITAMIN B1) tablet 100 mg  100 mg Oral Daily de Thayne Fine, Cortney E, NP   100 mg at 01/12/24 1055   Or   thiamine (VITAMIN B1) injection 100 mg  100 mg Intravenous Daily de Thayne Fine, Cortney E, NP         Discharge Medications: Please see discharge summary for a list of  discharge medications.  Relevant Imaging Results:  Relevant Lab Results:   Additional Information SS#: [id number]  Jonathan Neighbor, RN

## 2024-01-12 NOTE — Care Management Important Message (Signed)
 Important Message  Patient Details  Name: Flint Etter MRN: [id number] Date of Birth: [birth year]   Important Message Given:  Yes - Medicare IM     Wynonia Hedges 01/12/2024, 2:54 PM

## 2024-01-12 NOTE — Hospital Course (Addendum)
 73 yo patient with history of HTN and alcoholism was admitted with right thalamic ICH managed in ICU, has been stabilized and transferred to [facility] service 5/12 and needs placement. 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 PTA not on antithrombotics Continue PT OT planning for SNF He needs outpatient follow-up in 8 weeks w/ neuro   Hypertension: Not on meds at home, initially in nicardipine , now on amlodipine 10, prn iv.  BP fairly stable Goal BP <180    Hyperlipidemia: LDL goal less than 70 currently 137, started on Crestor   Alcohol abuse At risk of withdrawal patient manage on CIWA protocol Ativan thiamine folate  Class I obesity with BMI 30.1: Will benefit with weight loss, talabostat   Chronic anemia: Hemoglobin 10 to 11 g, monitor Recent Labs  Lab 01/10/24 0651 01/10/24 1611 01/10/24 2119 01/11/24 0634  GLUCAP  --  155* 137* 146*  HGBA1C 4.3*  --   --   --     Gout: Continue allopurinol 

## 2024-01-12 NOTE — Plan of Care (Signed)
  Problem: Education: Goal: Knowledge of disease or condition will improve 01/12/2024 1639 by Teola Felling, RN Outcome: Progressing 01/12/2024 1623 by Teola Felling, RN Outcome: Progressing Goal: Knowledge of secondary prevention will improve (MUST DOCUMENT ALL) 01/12/2024 1639 by Teola Felling, RN Outcome: Progressing 01/12/2024 1623 by Teola Felling, RN Outcome: Progressing Goal: Knowledge of patient specific risk factors will improve (DELETE if not current risk factor) 01/12/2024 1639 by Teola Felling, RN Outcome: Progressing 01/12/2024 1623 by Teola Felling, RN Outcome: Progressing   Problem: Intracerebral Hemorrhage Tissue Perfusion: Goal: Complications of Intracerebral Hemorrhage will be minimized Outcome: Progressing   Problem: Coping: Goal: Will verbalize positive feelings about self Outcome: Progressing Goal: Will identify appropriate support needs Outcome: Progressing   Problem: Health Behavior/Discharge Planning: Goal: Ability to manage health-related needs will improve Outcome: Progressing Goal: Goals will be collaboratively established with patient/family Outcome: Progressing   Problem: Self-Care: Goal: Ability to participate in self-care as condition permits will improve Outcome: Progressing Goal: Verbalization of feelings and concerns over difficulty with self-care will improve Outcome: Progressing Goal: Ability to communicate needs accurately will improve Outcome: Progressing   Problem: Nutrition: Goal: Risk of aspiration will decrease Outcome: Progressing Goal: Dietary intake will improve Outcome: Progressing   Problem: Education: Goal: Knowledge of General Education information will improve Description: Including pain rating scale, medication(s)/side effects and non-pharmacologic comfort measures Outcome: Progressing   Problem: Health Behavior/Discharge Planning: Goal: Ability to manage health-related needs will improve Outcome:  Progressing   Problem: Clinical Measurements: Goal: Ability to maintain clinical measurements within normal limits will improve Outcome: Progressing Goal: Will remain free from infection Outcome: Progressing Goal: Diagnostic test results will improve Outcome: Progressing Goal: Respiratory complications will improve Outcome: Progressing Goal: Cardiovascular complication will be avoided Outcome: Progressing   Problem: Activity: Goal: Risk for activity intolerance will decrease Outcome: Progressing   Problem: Nutrition: Goal: Adequate nutrition will be maintained Outcome: Progressing   Problem: Coping: Goal: Level of anxiety will decrease Outcome: Progressing   Problem: Elimination: Goal: Will not experience complications related to bowel motility Outcome: Progressing Goal: Will not experience complications related to urinary retention Outcome: Progressing   Problem: Pain Managment: Goal: General experience of comfort will improve and/or be controlled Outcome: Progressing   Problem: Safety: Goal: Ability to remain free from injury will improve Outcome: Progressing   Problem: Skin Integrity: Goal: Risk for impaired skin integrity will decrease Outcome: Progressing

## 2024-01-12 NOTE — Progress Notes (Signed)
 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 He remains on CIWA protocol   Subjective: Patient is alert, awake and oriented, resting comfortably Able to move all his extremities. Overnight heart rate 80s to 100, on room air BP 130s-160s stable Labs reviewed this morning mild hyponatremia 130 stable if lites mild leukocytosis chronic anemia hemoglobin 10.4 g Has been needing Ativan off of CIWA scale - last score 3 On prn hydralazine.    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 PTA not on antithrombotics Continue PT OT planning for SNF He needs outpatient follow-up in 8 weeks w/ neuro   Hypertension: Not on meds at home, initially in nicardipine , now on amlodipine 10, prn iv.  BP fairly stable Goal BP <180    Hyperlipidemia: LDL goal less than 70 currently 137, started on Crestor   Alcohol abuse At risk of withdrawal patient manage on CIWA protocol Ativan thiamine folate  Class I obesity with BMI 30.1: Will benefit with weight loss, talabostat   Chronic anemia: Hemoglobin 10 to 11 g, monitor Recent Labs  Lab 01/10/24 0651 01/10/24 1611 01/10/24 2119 01/11/24 0634  GLUCAP  --  155* 137* 146*  HGBA1C 4.3*  --   --   --     Gout: Continue allopurinol   DVT prophylaxis: enoxaparin (LOVENOX) injection 40 mg Start: 01/10/24 1000 SCD's Start: 01/09/24 0001 Code Status:   Code Status: Full Code Family Communication: plan of care discussed with patient at bedside. Patient status is: Remains  hospitalized because of severity of illness Level of care: Telemetry Medical   Dispo: The patient is from: HOME            Anticipated disposition: TBD Objective: Vitals last 24 hrs: Vitals:   01/12/24 0000 01/12/24 0400 01/12/24 0806 01/12/24 1204  BP: (!) 141/79 (!) 161/89 136/83 (!) 152/77  Pulse: (!) 105 (!) 109 89 83  Resp: 18 18 18 18   Temp: 99 F (37.2 C) 98.8 F (37.1 C) 98.3 F (36.8 C) 98.5 F (36.9 C)  TempSrc: Oral Oral  Oral  SpO2: 97% 100% 98% 100%  Weight:      Height:        Physical Examination: General exam: alert awake, older than stated age HEENT:Oral mucosa moist, Ear/Nose WNL grossly Respiratory system: Bilaterally clear BS, no use of accessory muscle Cardiovascular system: S1 & S2 +. Gastrointestinal system: Abdomen soft,  NT,ND,BS+ Nervous System: Alert, awake, oriented, following commands. Extremities: LE edema neg, warm extremities Skin: No rashes,warm. MSK: Normal muscle bulk/tone.   Data Reviewed: I have personally reviewed following labs and imaging studies ( see epic result tab) CBC: Recent Labs  Lab 01/08/24 2229 01/09/24 0733 01/10/24 0651 01/11/24 0715 01/12/24 0649  WBC 14.4* 13.5* 12.7* 12.7* 12.8*  NEUTROABS 10.6*  --   --   --   --   HGB 11.7* 12.2* 11.8* 11.0* 10.4*  HCT 34.0* 34.6* 34.5* 31.8* 30.5*  MCV 100.9* 99.1 102.4* 100.3* 100.3*  PLT 325 322 250 263 258   CMP: Recent Labs  Lab 01/09/24 0019 01/09/24 0733 01/10/24  1610 01/11/24 0715 01/12/24 0649  NA 138 136 137 134* 130*  K 2.7* 4.4 4.1 4.1 4.1  CL 107 105 105 104 101  CO2 21* 21* 21* 21* 20*  GLUCOSE 104* 106* 110* 117* 97  BUN 13 14 12 13 15   CREATININE 0.88 0.97 1.07 1.22 1.11  CALCIUM 8.3* 9.1 9.5 9.0 9.2   GFR: Estimated Creatinine Clearance (by C-G formula based on SCr of 1.11 mg/dL) Male: 96.0 mL/min Male: 66.5 mL/min Recent Labs  Lab 01/09/24 0019  AST 73*  ALT 40  ALKPHOS 87  BILITOT 1.7*  PROT 7.4  ALBUMIN 2.4*   No results for  input(s): "LIPASE", "AMYLASE" in the last 168 hours. No results for input(s): "AMMONIA" in the last 168 hours. Coagulation Profile: No results for input(s): "INR", "PROTIME" in the last 168 hours. Unresulted Labs (From admission, onward)     Start     Ordered   01/10/24 0500  CBC  Daily,   R     Question:  Specimen collection method  Answer:  Lab=Lab collect   01/09/24 1210   01/10/24 0500  Basic metabolic panel with GFR  Daily,   R     Question:  Specimen collection method  Answer:  Lab=Lab collect   01/09/24 1210           Antimicrobials/Microbiology: Anti-infectives (From admission, onward)    None         Component Value Date/Time   SDES SYNOVIAL KNEE 12/01/2013 1642   SPECREQUEST NONE 12/01/2013 1642   CULT  12/01/2013 1633    NO GROWTH 3 DAYS Note: Gram Stain Report Called to,Read Back By and Verified With: HASZ F AT 1826 ON [id number] BY POTEAT S Performed at [facility]   CULT  12/01/2013 1633    NO GROWTH 2 DAYS Performed at [facility]   REPTSTATUS 12/01/2013 FINAL 12/01/2013 1642    Medications reviewed:  Scheduled Meds:  allopurinol   300 mg Oral Daily   amLODipine  10 mg Oral Daily   enoxaparin (LOVENOX) injection  40 mg Subcutaneous Daily   folic acid  1 mg Oral Daily   multivitamin with minerals  1 tablet Oral Daily   pantoprazole  40 mg Oral QHS   rosuvastatin  20 mg Oral Daily   senna-docusate  1 tablet Oral BID   thiamine  100 mg Oral Daily   Or   thiamine  100 mg Intravenous Daily   Continuous Infusions:  Lesa Rape, MD [facility] 01/12/2024, 1:40 PM

## 2024-01-12 NOTE — TOC Progression Note (Addendum)
 Transition of Care [facility]) - Progression Note    Patient Details  Name: Cody Bautista MRN: [id number] Date of Birth: Nov 01, 1950  [facility] CM/SW Contact  Jonathan Neighbor, RN Phone Number: 01/12/2024, 3:18 PM  Clinical Narrative:     Pt doesn't take any prescription medications at home. He walks to go to the store. No PCP.  No DME. Current recommendations are for SNF rehab. Pt is agreeable to being faxed out in the [facility] area. He prefers [facility] if they offer. CM has updated [employer].  Awaiting bed offers and then insurance authorization. TOC following.  1550: [facility] has accepted and CM has asked TOC MOA to begin insurance auth.   Expected Discharge Plan: Skilled Nursing Facility    Expected Discharge Plan and Services                                               Social Determinants of Health (SDOH) Interventions SDOH Screenings   Food Insecurity: Food Insecurity Present (01/09/2024)  Housing: High Risk (01/09/2024)  Transportation Needs: Unmet Transportation Needs (01/09/2024)  Utilities: At Risk (01/09/2024)  Social Connections: Unknown (01/09/2024)  Tobacco Use: Low Risk  (01/08/2024)    Readmission Risk Interventions     No data to display

## 2024-01-12 NOTE — Plan of Care (Signed)
 In good spirits today.  Calm and cooperative.  Denies complaints except has some slight left knee pain but has declined pain meds for it.  Said warm pack felt better.    Problem: Education: Goal: Knowledge of disease or condition will improve Outcome: Progressing   Problem: Education: Goal: Knowledge of secondary prevention will improve (MUST DOCUMENT ALL) Outcome: Progressing   Problem: Education: Goal: Knowledge of patient specific risk factors will improve (DELETE if not current risk factor) Outcome: Progressing   Problem: Intracerebral Hemorrhage Tissue Perfusion: Goal: Complications of Intracerebral Hemorrhage will be minimized Outcome: Progressing   Problem: Nutrition: Goal: Risk of aspiration will decrease Outcome: Progressing   Problem: Clinical Measurements: Goal: Respiratory complications will improve Outcome: Progressing   Problem: Activity: Goal: Risk for activity intolerance will decrease Outcome: Progressing

## 2024-01-13 DIAGNOSIS — I610 Nontraumatic intracerebral hemorrhage in hemisphere, subcortical: Secondary | ICD-10-CM | POA: Diagnosis not present

## 2024-01-13 LAB — CBC
HCT: 30.9 % — ABNORMAL LOW (ref 39.0–52.0)
Hemoglobin: 10.8 g/dL — ABNORMAL LOW (ref 13.0–17.0)
MCH: 34.7 pg — ABNORMAL HIGH (ref 26.0–34.0)
MCHC: 35 g/dL (ref 30.0–36.0)
MCV: 99.4 fL (ref 80.0–100.0)
Platelets: 251 10*3/uL (ref 150–400)
RBC: 3.11 MIL/uL — ABNORMAL LOW (ref 4.22–5.81)
RDW: 11.9 % (ref 11.5–15.5)
WBC: 11.8 10*3/uL — ABNORMAL HIGH (ref 4.0–10.5)
nRBC: 0 % (ref 0.0–0.2)

## 2024-01-13 LAB — BASIC METABOLIC PANEL WITH GFR
Anion gap: 9 (ref 5–15)
BUN: 16 mg/dL (ref 8–23)
CO2: 22 mmol/L (ref 22–32)
Calcium: 9.3 mg/dL (ref 8.9–10.3)
Chloride: 101 mmol/L (ref 98–111)
Creatinine, Ser: 1.19 mg/dL (ref 0.61–1.24)
GFR, Estimated: >60 mL/min (ref >60)
Glucose, Bld: 96 mg/dL (ref 70–99)
Potassium: 4.3 mmol/L (ref 3.5–5.1)
Sodium: 132 mmol/L — ABNORMAL LOW (ref 135–145)

## 2024-01-13 MED ORDER — ADULT MULTIVITAMIN W/MINERALS CH: 1.0000 | ORAL_TABLET | Freq: Every day | ORAL | Status: AC

## 2024-01-13 MED ORDER — VITAMIN B-1 100 MG PO TABS: 100.0000 mg | ORAL_TABLET | Freq: Every day | ORAL | Status: AC

## 2024-01-13 MED ORDER — HYDRALAZINE HCL 25 MG PO TABS: 25.0000 mg | ORAL_TABLET | Freq: Three times a day (TID) | ORAL | Status: AC | PRN

## 2024-01-13 MED ORDER — ROSUVASTATIN CALCIUM 20 MG PO TABS: 20.0000 mg | ORAL_TABLET | Freq: Every day | ORAL | Status: DC

## 2024-01-13 MED ORDER — AMLODIPINE BESYLATE 10 MG PO TABS: 10.0000 mg | ORAL_TABLET | Freq: Every day | ORAL | Status: AC

## 2024-01-13 MED ORDER — SENNOSIDES-DOCUSATE SODIUM 8.6-50 MG PO TABS: 1.0000 | ORAL_TABLET | Freq: Two times a day (BID) | ORAL | Status: AC

## 2024-01-13 MED ORDER — PANTOPRAZOLE SODIUM 40 MG PO TBEC: 40.0000 mg | DELAYED_RELEASE_TABLET | Freq: Every day | ORAL | Status: AC

## 2024-01-13 NOTE — Discharge Summary (Signed)
 Physician Discharge Summary  Cody Bautista HYQ:[id number] DOB: 1951-01-22 DOA: 01/08/2024  PCP: Patient, No Pcp Per  Admit date: 01/08/2024 Discharge date: 01/13/2024 Recommendations for Outpatient Follow-up:  Follow up with PCP in 1 weeks-call for appointment Follow-up with neurology in 4 weeks Please obtain BMP/CBC in one week  Discharge Dispo: SNF Discharge Condition: Stable Code Status:   Code Status: Full Code Diet recommendation:  Diet Order             Diet regular Room service appropriate? 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 PTA not on antithrombotics Continue PT OT planning for SNF He needs outpatient follow-up in 8 weeks w/ neuro   Hypertension: Not on meds at home, initially in nicardipine , now on amlodipine 10, prn iv.  BP fairly stable Goal BP <180    Hyperlipidemia: LDL goal less than 70 currently 137, started on Crestor   Alcohol abuse At risk of withdrawal patient manage on CIWA protocol Ativan thiamine folate  Class I obesity with BMI 30.1: Will benefit with weight loss, talabostat   Chronic anemia: Hemoglobin 10 to 11 g,  monitor Recent Labs  Lab 01/10/24 0651 01/10/24 1611 01/10/24 2119 01/11/24 0634  GLUCAP  --  155* 137* 146*  HGBA1C 4.3*  --   --   --     Gout: Continue allopurinol    I called his son's number in the chart to discuss about discharge plan but not available. But Patient is agreeable for discharge to SNF.  Discharge Exam: Vitals:   01/13/24 0748 01/13/24 0800  BP: (!) 163/76 (!) 158/84  Pulse: 91   Resp: 17   Temp: 99.6 F (37.6 C)   SpO2: 99%    General: Pt is alert, awake, not in acute distress Cardiovascular: RRR, S1/S2 +, no rubs, no gallops Respiratory: CTA bilaterally, no wheezing, no rhonchi Abdominal: Soft, NT, ND, bowel sounds + Extremities: no edema, no cyanosis  Discharge Instructions  Discharge Instructions     Ambulatory referral to Neurology   Complete by: As directed    Follow up with stroke clinic NP at [facility] in about 4-6 weeks. Thanks.   Discharge instructions   Complete by: As directed    Follow-up with neurology in 4 weeks   Increase activity slowly   Complete by: As directed       Allergies as of 01/13/2024   No Known Allergies      Medication List     TAKE these medications    allopurinol  300 MG tablet Commonly known as: ZYLOPRIM  Take 1 tablet (300 mg total) by mouth daily.   amLODipine 10 MG tablet Commonly known as: NORVASC Take  1 tablet (10 mg total) by mouth daily.   hydrALAZINE 25 MG tablet Commonly known as: APRESOLINE Take 1 tablet (25 mg total) by mouth 3 (three) times daily as needed (is sbp>160).   multivitamin with minerals Tabs tablet Take 1 tablet by mouth daily.   pantoprazole 40 MG tablet Commonly known as: PROTONIX Take 1 tablet (40 mg total) by mouth at bedtime.   rosuvastatin 20 MG tablet Commonly known as: CRESTOR Take 1 tablet (20 mg total) by mouth daily.   senna-docusate 8.6-50 MG tablet Commonly known as: Senokot-S Take 1 tablet by mouth 2 (two) times daily.   thiamine 100 MG tablet Commonly  known as: Vitamin B-1 Take 1 tablet (100 mg total) by mouth daily.        Follow-up Information     [facility]. Schedule an appointment as soon as possible for a visit in 1 month(s).   Specialty: Neurology Why: stroke clinic Contact information: [address] [place]  [phone]               No Known Allergies  The results of significant diagnostics from this hospitalization (including imaging, microbiology, ancillary and laboratory) are listed below for reference.    Microbiology: Recent Results (from the past 240 hours)  MRSA Next Gen by PCR, Nasal     Status: None   Collection Time: 01/09/24  3:52 AM   Specimen: Nasal Mucosa; Nasal Swab  Result Value Ref Range Status   MRSA by PCR Next Gen NOT DETECTED NOT DETECTED Final    Comment: (NOTE) The GeneXpert MRSA Assay (FDA approved for NASAL specimens only), is one component of a comprehensive MRSA colonization surveillance program. It is not intended to diagnose MRSA infection nor to guide or monitor treatment for MRSA infections. Test performance is not FDA approved in patients less than 16 years old. Performed at [facility], [address], [place], [place] [address]     Procedures/Studies: MR BRAIN WO CONTRAST Result Date: 01/10/2024 CLINICAL DATA:  Follow-up examination for hemorrhagic stroke. EXAM: MRI HEAD WITHOUT CONTRAST MRA HEAD WITHOUT CONTRAST MRA NECK WITHOUT CONTRAST TECHNIQUE: Multiplanar, multiecho pulse sequences of the brain and surrounding structures were obtained without intravenous contrast. Angiographic images of the Circle of Willis were obtained using MRA technique without intravenous contrast. Angiographic images of the neck were obtained using MRA technique without intravenous contrast. Carotid stenosis measurements (when applicable) are obtained utilizing NASCET criteria, using the distal internal carotid diameter as  the denominator. COMPARISON:  CT from 01/09/2024. FINDINGS: MRI HEAD FINDINGS Brain: Diffuse prominence of the CSF containing spaces compatible generalized cerebral atrophy. Patchy T2/FLAIR hyperintensity involving the periventricular and deep white matter, consistent with chronic small vessel ischemic disease, mild in nature. Previously identified acute intraparenchymal hemorrhage centered at the right thalamocapsular region again seen, not significantly changed in size or morphology measuring up to 1.5 cm. Localized vasogenic edema without significant regional mass effect. No intraventricular extension. No visible underlying lesion or other structural abnormality on this noncontrast MRI. No other evidence for acute or subacute infarct. No other acute or chronic intracranial blood products. No mass lesion, midline shift or mass effect. No hydrocephalus or extra-axial fluid collection. Pituitary gland and suprasellar region within normal limits. Vascular: Major intracranial vascular flow voids are maintained. Skull and upper cervical spine: Craniocervical junction within normal limits. There is question of a 1.5 cm T1 hypointense lesion involving the C4 vertebral body (series 1, image 16). No  visible lesion to correspond this finding seen on prior CT. Bone marrow signal intensity otherwise within normal limits. No scalp soft tissue abnormality. Sinuses/Orbits: Right gaze preference noted. Paranasal sinuses are largely clear. No significant mastoid effusion. Other: None. MRA HEAD FINDINGS ANTERIOR CIRCULATION: Examination mildly degraded by motion artifact. Both internal carotid arteries are patent through the siphons without stenosis or other abnormality. A1 segments, anterior communicating artery complex, and anterior cerebral arteries widely patent. No M1 stenosis or occlusion. Distal MCA branches perfused and symmetric. POSTERIOR CIRCULATION: Visualized V4 segments widely patent, with the left being dominant.  Partially visualized PICA patent. Basilar patent without stenosis. Superior cerebral arteries patent bilaterally. Both PCAs primarily supplied via the basilar. PCAs patent to their distal aspects without stenosis. Small right posterior communicating artery noted. MRA NECK FINDINGS AORTIC ARCH: Partially visualized aortic arch within normal limits for caliber with standard 3 vessel morphology. No visible stenosis or other abnormality about the origin the great vessels. RIGHT CAROTID SYSTEM: Right common and internal carotid arteries are patent with antegrade flow. No evidence for dissection. No hemodynamically significant stenosis about the right carotid artery system. LEFT CAROTID SYSTEM: Left common and internal carotid arteries are patent with antegrade flow. No evidence for dissection. No hemodynamically significant stenosis about the left carotid artery system. VERTEBRAL ARTERIES: Both vertebral arteries arise from the subclavian arteries. No visible proximal subclavian artery stenosis. Left vertebral artery dominant. Vertebral arteries are patent with antegrade flow. No evidence for dissection or stenosis. Other: None. IMPRESSION: MRI HEAD: 1. No significant interval change in size and morphology of acute intraparenchymal hemorrhage centered at the right thalamocapsular region. Localized vasogenic edema without significant regional mass effect. No intraventricular extension. No visible underlying structural abnormality. 2. No other acute intracranial abnormality. 3. Underlying age-related cerebral atrophy with mild chronic small vessel ischemic disease. 4. Question 1.5 cm T1 hypointense lesion involving the C4 vertebral body. No visible lesion to correspond with this finding seen on prior CT. Finding is indeterminate, and could be related underlying spondylosis, although correlation with dedicated MRI of the cervical spine, with and without contrast, suggested for further evaluation. MRA HEAD: Negative  intracranial MRA for large vessel occlusion or other emergent finding. No hemodynamically significant or correctable stenosis. MRA NECK: Negative MRA of the neck. No hemodynamically significant stenosis or other acute vascular abnormality. Electronically Signed   By: Virgia Griffins M.D.   On: 01/10/2024 18:57   MR ANGIO HEAD WO CONTRAST Result Date: 01/10/2024 CLINICAL DATA:  Follow-up examination for hemorrhagic stroke. EXAM: MRI HEAD WITHOUT CONTRAST MRA HEAD WITHOUT CONTRAST MRA NECK WITHOUT CONTRAST TECHNIQUE: Multiplanar, multiecho pulse sequences of the brain and surrounding structures were obtained without intravenous contrast. Angiographic images of the Circle of Willis were obtained using MRA technique without intravenous contrast. Angiographic images of the neck were obtained using MRA technique without intravenous contrast. Carotid stenosis measurements (when applicable) are obtained utilizing NASCET criteria, using the distal internal carotid diameter as the denominator. COMPARISON:  CT from 01/09/2024. FINDINGS: MRI HEAD FINDINGS Brain: Diffuse prominence of the CSF containing spaces compatible generalized cerebral atrophy. Patchy T2/FLAIR hyperintensity involving the periventricular and deep white matter, consistent with chronic small vessel ischemic disease, mild in nature. Previously identified acute intraparenchymal hemorrhage centered at the right thalamocapsular region again seen, not significantly changed in size or morphology measuring up to 1.5 cm. Localized vasogenic edema without significant regional mass effect. No intraventricular extension. No visible underlying lesion or other structural abnormality on this noncontrast MRI. No other evidence for acute  or subacute infarct. No other acute or chronic intracranial blood products. No mass lesion, midline shift or mass effect. No hydrocephalus or extra-axial fluid collection. Pituitary gland and suprasellar region within normal limits.  Vascular: Major intracranial vascular flow voids are maintained. Skull and upper cervical spine: Craniocervical junction within normal limits. There is question of a 1.5 cm T1 hypointense lesion involving the C4 vertebral body (series 1, image 16). No visible lesion to correspond this finding seen on prior CT. Bone marrow signal intensity otherwise within normal limits. No scalp soft tissue abnormality. Sinuses/Orbits: Right gaze preference noted. Paranasal sinuses are largely clear. No significant mastoid effusion. Other: None. MRA HEAD FINDINGS ANTERIOR CIRCULATION: Examination mildly degraded by motion artifact. Both internal carotid arteries are patent through the siphons without stenosis or other abnormality. A1 segments, anterior communicating artery complex, and anterior cerebral arteries widely patent. No M1 stenosis or occlusion. Distal MCA branches perfused and symmetric. POSTERIOR CIRCULATION: Visualized V4 segments widely patent, with the left being dominant. Partially visualized PICA patent. Basilar patent without stenosis. Superior cerebral arteries patent bilaterally. Both PCAs primarily supplied via the basilar. PCAs patent to their distal aspects without stenosis. Small right posterior communicating artery noted. MRA NECK FINDINGS AORTIC ARCH: Partially visualized aortic arch within normal limits for caliber with standard 3 vessel morphology. No visible stenosis or other abnormality about the origin the great vessels. RIGHT CAROTID SYSTEM: Right common and internal carotid arteries are patent with antegrade flow. No evidence for dissection. No hemodynamically significant stenosis about the right carotid artery system. LEFT CAROTID SYSTEM: Left common and internal carotid arteries are patent with antegrade flow. No evidence for dissection. No hemodynamically significant stenosis about the left carotid artery system. VERTEBRAL ARTERIES: Both vertebral arteries arise from the subclavian arteries. No  visible proximal subclavian artery stenosis. Left vertebral artery dominant. Vertebral arteries are patent with antegrade flow. No evidence for dissection or stenosis. Other: None. IMPRESSION: MRI HEAD: 1. No significant interval change in size and morphology of acute intraparenchymal hemorrhage centered at the right thalamocapsular region. Localized vasogenic edema without significant regional mass effect. No intraventricular extension. No visible underlying structural abnormality. 2. No other acute intracranial abnormality. 3. Underlying age-related cerebral atrophy with mild chronic small vessel ischemic disease. 4. Question 1.5 cm T1 hypointense lesion involving the C4 vertebral body. No visible lesion to correspond with this finding seen on prior CT. Finding is indeterminate, and could be related underlying spondylosis, although correlation with dedicated MRI of the cervical spine, with and without contrast, suggested for further evaluation. MRA HEAD: Negative intracranial MRA for large vessel occlusion or other emergent finding. No hemodynamically significant or correctable stenosis. MRA NECK: Negative MRA of the neck. No hemodynamically significant stenosis or other acute vascular abnormality. Electronically Signed   By: Virgia Griffins M.D.   On: 01/10/2024 18:57   MR ANGIO NECK WO CONTRAST Result Date: 01/10/2024 CLINICAL DATA:  Follow-up examination for hemorrhagic stroke. EXAM: MRI HEAD WITHOUT CONTRAST MRA HEAD WITHOUT CONTRAST MRA NECK WITHOUT CONTRAST TECHNIQUE: Multiplanar, multiecho pulse sequences of the brain and surrounding structures were obtained without intravenous contrast. Angiographic images of the Circle of Willis were obtained using MRA technique without intravenous contrast. Angiographic images of the neck were obtained using MRA technique without intravenous contrast. Carotid stenosis measurements (when applicable) are obtained utilizing NASCET criteria, using the distal internal  carotid diameter as the denominator. COMPARISON:  CT from 01/09/2024. FINDINGS: MRI HEAD FINDINGS Brain: Diffuse prominence of the CSF containing spaces compatible generalized cerebral atrophy. Patchy  T2/FLAIR hyperintensity involving the periventricular and deep white matter, consistent with chronic small vessel ischemic disease, mild in nature. Previously identified acute intraparenchymal hemorrhage centered at the right thalamocapsular region again seen, not significantly changed in size or morphology measuring up to 1.5 cm. Localized vasogenic edema without significant regional mass effect. No intraventricular extension. No visible underlying lesion or other structural abnormality on this noncontrast MRI. No other evidence for acute or subacute infarct. No other acute or chronic intracranial blood products. No mass lesion, midline shift or mass effect. No hydrocephalus or extra-axial fluid collection. Pituitary gland and suprasellar region within normal limits. Vascular: Major intracranial vascular flow voids are maintained. Skull and upper cervical spine: Craniocervical junction within normal limits. There is question of a 1.5 cm T1 hypointense lesion involving the C4 vertebral body (series 1, image 16). No visible lesion to correspond this finding seen on prior CT. Bone marrow signal intensity otherwise within normal limits. No scalp soft tissue abnormality. Sinuses/Orbits: Right gaze preference noted. Paranasal sinuses are largely clear. No significant mastoid effusion. Other: None. MRA HEAD FINDINGS ANTERIOR CIRCULATION: Examination mildly degraded by motion artifact. Both internal carotid arteries are patent through the siphons without stenosis or other abnormality. A1 segments, anterior communicating artery complex, and anterior cerebral arteries widely patent. No M1 stenosis or occlusion. Distal MCA branches perfused and symmetric. POSTERIOR CIRCULATION: Visualized V4 segments widely patent, with the left  being dominant. Partially visualized PICA patent. Basilar patent without stenosis. Superior cerebral arteries patent bilaterally. Both PCAs primarily supplied via the basilar. PCAs patent to their distal aspects without stenosis. Small right posterior communicating artery noted. MRA NECK FINDINGS AORTIC ARCH: Partially visualized aortic arch within normal limits for caliber with standard 3 vessel morphology. No visible stenosis or other abnormality about the origin the great vessels. RIGHT CAROTID SYSTEM: Right common and internal carotid arteries are patent with antegrade flow. No evidence for dissection. No hemodynamically significant stenosis about the right carotid artery system. LEFT CAROTID SYSTEM: Left common and internal carotid arteries are patent with antegrade flow. No evidence for dissection. No hemodynamically significant stenosis about the left carotid artery system. VERTEBRAL ARTERIES: Both vertebral arteries arise from the subclavian arteries. No visible proximal subclavian artery stenosis. Left vertebral artery dominant. Vertebral arteries are patent with antegrade flow. No evidence for dissection or stenosis. Other: None. IMPRESSION: MRI HEAD: 1. No significant interval change in size and morphology of acute intraparenchymal hemorrhage centered at the right thalamocapsular region. Localized vasogenic edema without significant regional mass effect. No intraventricular extension. No visible underlying structural abnormality. 2. No other acute intracranial abnormality. 3. Underlying age-related cerebral atrophy with mild chronic small vessel ischemic disease. 4. Question 1.5 cm T1 hypointense lesion involving the C4 vertebral body. No visible lesion to correspond with this finding seen on prior CT. Finding is indeterminate, and could be related underlying spondylosis, although correlation with dedicated MRI of the cervical spine, with and without contrast, suggested for further evaluation. MRA HEAD:  Negative intracranial MRA for large vessel occlusion or other emergent finding. No hemodynamically significant or correctable stenosis. MRA NECK: Negative MRA of the neck. No hemodynamically significant stenosis or other acute vascular abnormality. Electronically Signed   By: Virgia Griffins M.D.   On: 01/10/2024 18:57   ECHOCARDIOGRAM COMPLETE Result Date: 01/09/2024    ECHOCARDIOGRAM REPORT   Patient Name:   Cody Bautista Date of Exam: 01/09/2024 Medical Rec #:  [id number]       Height:       68.0 in Accession #:  [id number]      Weight:       198.4 lb Date of Birth:  05/06/1951       BSA:          2.037 m Patient Age:    73 years        BP:           128/79 mmHg Patient Gender: M               HR:           78 bpm. Exam Location:  Inpatient Procedure: 2D Echo, Color Doppler and Cardiac Doppler (Both Spectral and Color            Flow Doppler were utilized during procedure). Indications:    Stroke i36.9  History:        Patient has no prior history of Echocardiogram examinations.                 Risk Factors:Hypertension.  Sonographer:    Sherline Distel Senior RDCS Referring Phys: [phone] MCNEILL P KIRKPATRICK  Sonographer Comments: No true parasternal window IMPRESSIONS  1. Left ventricular ejection fraction, by estimation, is 65 to 70%. The left ventricle has normal function. The left ventricle has no regional wall motion abnormalities. There is moderate asymmetric left ventricular hypertrophy of the septal segment. Left ventricular diastolic parameters are consistent with Grade I diastolic dysfunction (impaired relaxation).  2. Right ventricular systolic function is normal. The right ventricular size is normal. Tricuspid regurgitation signal is inadequate for assessing PA pressure.  3. The mitral valve is normal in structure. Trivial mitral valve regurgitation. No evidence of mitral stenosis.  4. The aortic valve has an indeterminant number of cusps. There is mild calcification of the aortic valve. There is mild  thickening of the aortic valve. Aortic valve regurgitation is not visualized. Aortic valve sclerosis is present, with no evidence of  aortic valve stenosis.  5. The inferior vena cava is normal in size with greater than 50% respiratory variability, suggesting right atrial pressure of 3 mmHg. FINDINGS  Left Ventricle: Left ventricular ejection fraction, by estimation, is 65 to 70%. The left ventricle has normal function. The left ventricle has no regional wall motion abnormalities. Strain was performed and the global longitudinal strain is indeterminate. The left ventricular internal cavity size was normal in size. There is moderate asymmetric left ventricular hypertrophy of the septal segment. Left ventricular diastolic parameters are consistent with Grade I diastolic dysfunction (impaired relaxation). Right Ventricle: The right ventricular size is normal. No increase in right ventricular wall thickness. Right ventricular systolic function is normal. Tricuspid regurgitation signal is inadequate for assessing PA pressure. Left Atrium: Left atrial size was normal in size. Right Atrium: Right atrial size was normal in size. Pericardium: There is no evidence of pericardial effusion. Mitral Valve: The mitral valve is normal in structure. Trivial mitral valve regurgitation. No evidence of mitral valve stenosis. Tricuspid Valve: The tricuspid valve is normal in structure. Tricuspid valve regurgitation is mild . No evidence of tricuspid stenosis. Aortic Valve: The aortic valve has an indeterminant number of cusps. There is mild calcification of the aortic valve. There is mild thickening of the aortic valve. Aortic valve regurgitation is not visualized. Aortic valve sclerosis is present, with no evidence of aortic valve stenosis. Pulmonic Valve: The pulmonic valve was not well visualized. Pulmonic valve regurgitation is not visualized. No evidence of pulmonic stenosis. Aorta: The ascending aorta was not well visualized and  the aortic root is  normal in size and structure. Venous: The inferior vena cava is normal in size with greater than 50% respiratory variability, suggesting right atrial pressure of 3 mmHg. IAS/Shunts: No atrial level shunt detected by color flow Doppler. Additional Comments: 3D was performed not requiring image post processing on an independent workstation and was indeterminate.  LEFT VENTRICLE PLAX 2D LVOT diam:     1.90 cm   Diastology LV SV:         48        LV e' medial:    5.44 cm/s LV SV Index:   24        LV E/e' medial:  9.5 LVOT Area:     2.84 cm  LV e' lateral:   5.98 cm/s                          LV E/e' lateral: 8.6  RIGHT VENTRICLE RV S prime:     11.60 cm/s TAPSE (M-mode): 2.2 cm LEFT ATRIUM           Index        RIGHT ATRIUM           Index LA Vol (A2C): 24.3 ml 11.93 ml/m  RA Area:     12.10 cm LA Vol (A4C): 41.0 ml 20.13 ml/m  RA Volume:   25.70 ml  12.62 ml/m  AORTIC VALVE LVOT Vmax:   96.95 cm/s LVOT Vmean:  66.450 cm/s LVOT VTI:    0.170 m MITRAL VALVE MV Area (PHT): 2.45 cm    SHUNTS MV Decel Time: 310 msec    Systemic VTI:  0.17 m MV E velocity: 51.50 cm/s  Systemic Diam: 1.90 cm MV A velocity: 70.90 cm/s MV E/A ratio:  0.73 Janelle Mediate MD Electronically signed by Janelle Mediate MD Signature Date/Time: 01/09/2024/12:54:59 PM    Final    CT HEAD WO CONTRAST ( ) Result Date: 01/09/2024 CLINICAL DATA:  Provided history: Stroke, hemorrhagic. EXAM: CT HEAD WITHOUT CONTRAST TECHNIQUE: Contiguous axial images were obtained from the base of the skull through the vertex without intravenous contrast. RADIATION DOSE REDUCTION: This exam was performed according to the departmental dose-optimization program which includes automated exposure control, adjustment of the mA and/or kV according to patient size and/or use of iterative reconstruction technique. COMPARISON:  Head CT 01/08/2024. FINDINGS: Mildly motion degraded exam. Brain: Generalized cerebral atrophy. 1.5 x 1.1 cm acute hemorrhage within  the right thalamus with mild surrounding edema, unchanged from the prior head CT of 01/08/2024 (remeasured on prior). No extra-axial fluid collection. No midline shift or hydrocephalus. Vascular: No hyperdense vessel. Skull: No calvarial fracture or aggressive osseous lesion. Sinuses/Orbits: No mass or acute finding within the imaged orbits. Mild partial opacification of left ethmoid air cells. Trace mucosal thickening within the right maxillary sinus. IMPRESSION: 1. Mildly motion degraded exam. 2. 1.5 x 1.1 cm acute hemorrhage within the right thalamus with mild surrounding edema, unchanged from the prior head CT of 01/08/2024. 3. Generalized cerebral atrophy. 4. Minor paranasal sinus disease, as described. Electronically Signed   By: Bascom Lily D.O.   On: 01/09/2024 08:11   DG Pelvis Portable Result Date: 01/08/2024 CLINICAL DATA:  Fall EXAM: PORTABLE PELVIS 1-2 VIEWS COMPARISON:  None Available. FINDINGS: There is no evidence of pelvic fracture or diastasis. No pelvic bone lesions are seen. Hip joints and SI joints symmetric. IMPRESSION: Negative. Electronically Signed   By: Janeece Mechanic M.D.   On: 01/08/2024 23:24  DG Chest Port 1 View Result Date: 01/08/2024 CLINICAL DATA:  Fall EXAM: PORTABLE CHEST 1 VIEW COMPARISON:  None Available. FINDINGS: The heart size and mediastinal contours are within normal limits. Both lungs are clear. The visualized skeletal structures are unremarkable. IMPRESSION: No active disease. Electronically Signed   By: Janeece Mechanic M.D.   On: 01/08/2024 23:23   CT HEAD WO CONTRAST ( ) Result Date: 01/08/2024 CLINICAL DATA:  Recent fall with headaches and neck pain, initial encounter EXAM: CT HEAD WITHOUT CONTRAST CT CERVICAL SPINE WITHOUT CONTRAST TECHNIQUE: Multidetector CT imaging of the head and cervical spine was performed following the standard protocol without intravenous contrast. Multiplanar CT image reconstructions of the cervical spine were also generated. RADIATION  DOSE REDUCTION: This exam was performed according to the departmental dose-optimization program which includes automated exposure control, adjustment of the mA and/or kV according to patient size and/or use of iterative reconstruction technique. COMPARISON:  06/09/2006 FINDINGS: CT HEAD FINDINGS Brain: There is focal area of acute hemorrhage within the right thalamus and extending into the posterior horn of right internal capsule. This measures 1.5 x 1.1 x 1.2 cm in greatest dimension. Some minimal surrounding edema is noted. No other focal area of hemorrhage is seen. Mild atrophic changes are noted. Mild chronic white ischemic changes are noted. Vascular: No hyperdense vessel or unexpected calcification. Skull: Normal. Negative for fracture or focal lesion. Sinuses/Orbits: No acute finding. Other: None. CT CERVICAL SPINE FINDINGS Alignment: Within normal limits. Skull base and vertebrae: 7 cervical segments are well visualized. Vertebral body height is well maintained. Multilevel osteophytic changes are seen. No acute fracture or acute facet abnormality is noted. The odontoid is within normal limits. Soft tissues and spinal canal: Surrounding soft tissue structures are within normal limits. Upper chest: Visualized lung apices are unremarkable. Other: None IMPRESSION: CT of the head: Focal acute hemorrhage in the right thalamus extending into the posterior horn of the internal capsule. No other focal abnormality is noted. CT of the cervical spine: Mild degenerative change without acute abnormality. Critical Value/emergent results were called by telephone at the time of interpretation on 01/08/2024 at 10:50 pm to Dr Moses Arenas , who verbally acknowledged these results. Electronically Signed   By: Violeta Grey M.D.   On: 01/08/2024 22:56   CT Cervical Spine Wo Contrast Result Date: 01/08/2024 CLINICAL DATA:  Recent fall with headaches and neck pain, initial encounter EXAM: CT HEAD WITHOUT CONTRAST CT CERVICAL SPINE  WITHOUT CONTRAST TECHNIQUE: Multidetector CT imaging of the head and cervical spine was performed following the standard protocol without intravenous contrast. Multiplanar CT image reconstructions of the cervical spine were also generated. RADIATION DOSE REDUCTION: This exam was performed according to the departmental dose-optimization program which includes automated exposure control, adjustment of the mA and/or kV according to patient size and/or use of iterative reconstruction technique. COMPARISON:  06/09/2006 FINDINGS: CT HEAD FINDINGS Brain: There is focal area of acute hemorrhage within the right thalamus and extending into the posterior horn of right internal capsule. This measures 1.5 x 1.1 x 1.2 cm in greatest dimension. Some minimal surrounding edema is noted. No other focal area of hemorrhage is seen. Mild atrophic changes are noted. Mild chronic white ischemic changes are noted. Vascular: No hyperdense vessel or unexpected calcification. Skull: Normal. Negative for fracture or focal lesion. Sinuses/Orbits: No acute finding. Other: None. CT CERVICAL SPINE FINDINGS Alignment: Within normal limits. Skull base and vertebrae: 7 cervical segments are well visualized. Vertebral body height is well maintained. Multilevel osteophytic changes are  seen. No acute fracture or acute facet abnormality is noted. The odontoid is within normal limits. Soft tissues and spinal canal: Surrounding soft tissue structures are within normal limits. Upper chest: Visualized lung apices are unremarkable. Other: None IMPRESSION: CT of the head: Focal acute hemorrhage in the right thalamus extending into the posterior horn of the internal capsule. No other focal abnormality is noted. CT of the cervical spine: Mild degenerative change without acute abnormality. Critical Value/emergent results were called by telephone at the time of interpretation on 01/08/2024 at 10:50 pm to Dr Moses Arenas , who verbally acknowledged these results.  Electronically Signed   By: Violeta Grey M.D.   On: 01/08/2024 22:56    Labs: BNP (last 3 results) No results for input(s): "BNP" in the last 8760 hours. Basic Metabolic Panel: Recent Labs  Lab 01/09/24 0733 01/10/24 0651 01/11/24 0715 01/12/24 0649 01/13/24 0608  NA 136 137 134* 130* 132*  K 4.4 4.1 4.1 4.1 4.3  CL 105 105 104 101 101  CO2 21* 21* 21* 20* 22  GLUCOSE 106* 110* 117* 97 96  BUN 14 12 13 15 16   CREATININE 0.97 1.07 1.22 1.11 1.19  CALCIUM 9.1 9.5 9.0 9.2 9.3   Liver Function Tests: Recent Labs  Lab 01/09/24 0019  AST 73*  ALT 40  ALKPHOS 87  BILITOT 1.7*  PROT 7.4  ALBUMIN 2.4*   No results for input(s): "LIPASE", "AMYLASE" in the last 168 hours. No results for input(s): "AMMONIA" in the last 168 hours. CBC: Recent Labs  Lab 01/08/24 2229 01/09/24 0733 01/10/24 0651 01/11/24 0715 01/12/24 0649 01/13/24 0608  WBC 14.4* 13.5* 12.7* 12.7* 12.8* 11.8*  NEUTROABS 10.6*  --   --   --   --   --   HGB 11.7* 12.2* 11.8* 11.0* 10.4* 10.8*  HCT 34.0* 34.6* 34.5* 31.8* 30.5* 30.9*  MCV 100.9* 99.1 102.4* 100.3* 100.3* 99.4  PLT 325 322 250 263 258 251  Cardiac Enzymes: Recent Labs  Lab 01/09/24 0019  CKTOTAL 84  CBG: Recent Labs  Lab 01/10/24 1611 01/10/24 2119 01/11/24 0634  GLUCAP 155* 137* 146*  Anemia work up No results for input(s): "VITAMINB12", "FOLATE", "FERRITIN", "TIBC", "IRON", "RETICCTPCT" in the last 72 hours. Urinalysis    Component Value Date/Time   COLORURINE ORANGE BIOCHEMICALS MAY BE AFFECTED BY COLOR (A) 10/09/2007 0550   APPEARANCEUR TURBID (A) 10/09/2007 0550   LABSPEC 1.029 10/09/2007 0550   PHURINE 5.5 10/09/2007 0550   GLUCOSEU NEGATIVE 10/09/2007 0550   HGBUR TRACE (A) 10/09/2007 0550   BILIRUBINUR moderate 12/01/2013 1239   KETONESUR 40 (A) 10/09/2007 0550   PROTEINUR 100 12/01/2013 1239   PROTEINUR 100 (A) 10/09/2007 0550   UROBILINOGEN 1.0 12/01/2013 1239   UROBILINOGEN 4.0 (H) 10/09/2007 0550   NITRITE neg  12/01/2013 1239   NITRITE POSITIVE (A) 10/09/2007 0550   LEUKOCYTESUR Negative 12/01/2013 1239   Sepsis Labs Recent Labs  Lab 01/10/24 0651 01/11/24 0715 01/12/24 0649 01/13/24 0608  WBC 12.7* 12.7* 12.8* 11.8*   Microbiology Recent Results (from the past 240 hours)  MRSA Next Gen by PCR, Nasal     Status: None   Collection Time: 01/09/24  3:52 AM   Specimen: Nasal Mucosa; Nasal Swab  Result Value Ref Range Status   MRSA by PCR Next Gen NOT DETECTED NOT DETECTED Final    Comment: (NOTE) The GeneXpert MRSA Assay (FDA approved for NASAL specimens only), is one component of a comprehensive MRSA colonization surveillance program. It is not  intended to diagnose MRSA infection nor to guide or monitor treatment for MRSA infections. Test performance is not FDA approved in patients less than 45 years old. Performed at [facility], [address] [place], [place] [address]    Time coordinating discharge: 35 minutes  SIGNED: Lesa Rape, MD  [facility] 01/13/2024, 8:41 AM  If 7PM-7AM, please contact night-coverage [email or web address]

## 2024-01-13 NOTE — Progress Notes (Signed)
 Physical Therapy Treatment Patient Details Name: Cody Bautista MRN: [id number] DOB: [birth year] Today's Date: 01/13/2024   History of Present Illness Pt is a 73 y.o. male presenting 5/8 after fall at home. Endorsing ETOH use the night before.  CTH with R thalamic hemorrhage. PMH significant for HTN, gout, BPH.    PT Comments  Patient is agreeable to PT session. He continues to have left side weakness with assistance required for bed mobility and transfers. Standing balance is poor with limited standing tolerance for progression of walking. Recommend to continue PT to maximize independence and facilitate return to prior level of function. Rehabilitation < 3 hours/day recommended after this hospital stay.    If plan is discharge home, recommend the following: A lot of help with walking and/or transfers;A lot of help with bathing/dressing/bathroom;Assistance with cooking/housework;Assistance with feeding;Direct supervision/assist for medications management;Direct supervision/assist for financial management;Assist for transportation;Help with stairs or ramp for entrance;Supervision due to cognitive status   Can travel by private vehicle     Yes  Equipment Recommendations  Wheelchair cushion (measurements PT);Wheelchair (measurements PT);Rolling walker (2 wheels);BSC/3in1    Recommendations for Other Services       Precautions / Restrictions Precautions Precautions: Fall Recall of Precautions/Restrictions: Impaired Restrictions Weight Bearing Restrictions Per Provider Order: No     Mobility  Bed Mobility Overal bed mobility: Needs Assistance Bed Mobility: Supine to Sit, Sit to Supine     Supine to sit: Min assist Sit to supine: Min assist   General bed mobility comments: increased time and cues for sequencing and active movement of left side    Transfers Overall transfer level: Needs assistance Equipment used: Rolling walker (2 wheels) Transfers: Sit to/from Stand Sit to  Stand: Max assist          Lateral/Scoot Transfers: Min assist General transfer comment: lifting and lowering assistance provided with transfers. cues for hand placement, anterior weight shifting, and increased eccentric control with standing. lateral scooting with Min A and cues for hand placement. activity tolerance limited by fatigue    Ambulation/Gait             Pre-gait activities: cues for upright standing posture and with cues for full knee extension with weight bearing on left. unable to take steps at this time     Stairs             Wheelchair Mobility     Tilt Bed    Modified Rankin (Stroke Patients Only)       Balance Overall balance assessment: Needs assistance Sitting-balance support: Feet supported Sitting balance-Leahy Scale: Fair   Postural control: Left lateral lean Standing balance support: Single extremity supported, Bilateral upper extremity supported Standing balance-Leahy Scale: Poor Standing balance comment: external support required in addition to rolling walker                            Communication Communication Communication: No apparent difficulties  Cognition Arousal: Alert Behavior During Therapy: WFL for tasks assessed/performed   PT - Cognitive impairments: No family/caregiver present to determine baseline, Problem solving, Safety/Judgement                       PT - Cognition Comments: patient able to follow single step commands with increased time Following commands: Impaired Following commands impaired: Follows one step commands with increased time    Cueing Cueing Techniques: Verbal cues  Exercises      General Comments  Pertinent Vitals/Pain Pain Assessment Pain Assessment: Faces Faces Pain Scale: Hurts a little bit Pain Location: L knee Pain Descriptors / Indicators: Discomfort Pain Intervention(s): Limited activity within patient's tolerance, Monitored during session,  Repositioned    Home Living                          Prior Function            PT Goals (current goals can now be found in the care plan section) Acute Rehab PT Goals Patient Stated Goal: to improve strength PT Goal Formulation: With patient Time For Goal Achievement: 01/24/24 Potential to Achieve Goals: Fair Progress towards PT goals: Progressing toward goals    Frequency    Min 3X/week      PT Plan      Co-evaluation              AM-PAC PT "6 Clicks" Mobility   Outcome Measure  Help needed turning from your back to your side while in a flat bed without using bedrails?: A Little Help needed moving from lying on your back to sitting on the side of a flat bed without using bedrails?: A Little Help needed moving to and from a bed to a chair (including a wheelchair)?: A Lot Help needed standing up from a chair using your arms (e.g., wheelchair or bedside chair)?: A Lot Help needed to walk in hospital room?: A Lot Help needed climbing 3-5 steps with a railing? : Total 6 Click Score: 13    End of Session   Activity Tolerance: Patient tolerated treatment well Patient left: in bed;with call bell/phone within reach;with bed alarm set;with SCD's reapplied   PT Visit Diagnosis: Unsteadiness on feet (R26.81);Other abnormalities of gait and mobility (R26.89);Muscle weakness (generalized) (M62.81);Hemiplegia and hemiparesis     Time: 0851-0905 PT Time Calculation (min) (ACUTE ONLY): 14 min  Charges:    $Therapeutic Activity: 8-22 mins PT General Charges $$ ACUTE PT VISIT: 1 Visit                     Cody Bautista, PT, MPT    Cody Bautista 01/13/2024, 10:53 AM

## 2024-01-13 NOTE — TOC Transition Note (Signed)
 Transition of Care [facility]) - Discharge Note   Patient Details  Name: Cody Bautista MRN: [id number] Date of Birth: 1951/01/09  [facility]) CM/SW Contact:  Jonathan Neighbor, RN Phone Number: 01/13/2024, 10:22 AM   Clinical Narrative:     Pt is discharging to [facility] today. Pt will transport via [facility]. Peggy his sister is aware.   Room: 113 Number for report: [phone]  Final next level of care: Skilled Nursing Facility Barriers to Discharge: No Barriers Identified   Patient Goals and CMS Choice   CMS Medicare.gov Compare Post Acute Care list provided to:: Patient Choice offered to / list presented to : Patient      Discharge Placement              Patient [facility] bed at: [facility] Patient to be transferred to facility by: [facility] Name of family member notified: Carles Cheadle --sister Patient and family notified of of transfer: 01/13/24  Discharge Plan and Services Additional resources added to the After Visit Summary for                                       Social Drivers of Health (SDOH) Interventions SDOH Screenings   Food Insecurity: Food Insecurity Present (01/09/2024)  Housing: High Risk (01/09/2024)  Transportation Needs: Unmet Transportation Needs (01/09/2024)  Utilities: At Risk (01/09/2024)  Social Connections: Unknown (01/09/2024)  Tobacco Use: Low Risk  (01/08/2024)     Readmission Risk Interventions     No data to display

## 2024-02-18 ENCOUNTER — Inpatient Hospital Stay: Admitting: Neurology

## 2024-03-01 NOTE — Progress Notes (Unsigned)
 Patient: Cody Bautista Date of Birth: [birth year]  Reason for Visit: Stroke clinic follow-up History from: Patient Primary Neurologist: Rosemarie   ASSESSMENT AND PLAN 73 y.o. 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  Vascular risk factors: HTN, HLD, alcohol abuse.  Now staying at SNF, no longer drinking alcohol.  Continues with left-sided weakness 4/5 arm, 3/5 left leg, mild dysarthria.   -Check MRI cervical spine with and without contrast, check kidney function today prior to contrast.  MRI imaging during hospitalization recommended dedicated MRI cervical spine for questionable hypointense lesion at the C4 vertebral body.  He does endorse some mild left-sided neck pain, radiating to left shoulder 4. Question 1.5 cm T1 hypointense lesion involving the C4 vertebral body. No visible lesion to correspond with this finding seen on prior CT. Finding is indeterminate, and could be related underlying spondylosis, although correlation with dedicated MRI of the cervical spine, with and without contrast, suggested for further evaluation. - Recommend continue physical therapy, exercise at SNF, working on left sided weakness, spasticity of the left leg - Recommend continue to avoid alcohol use  - Strict management of vascular risk factors with a goal BP less than 130/90, A1c less than 7.0, LDL less than 70 for secondary stroke prevention - Follow-up in 6 months or sooner if needed  HISTORY OF PRESENT ILLNESS: Today 03/02/24 Here today alone, was brought by [facility]. Prior he was renting a room from a friend, he was driving a car. Prior he was not taking care of himself or seeing a doctor regularly. Admits he was drinking too much. He is no longer drinking. He continues with weakness to left arm and leg. Walking is hard, back in physical therapy. He can uses a walker, but mainly uses a wheelchair, he can scoot with his feet. Mild slurred speech. Eating well, gets 3 meals a  day. BP was good 121/69, on Amlodipine  10 mg daily, Lipitor 40 mg daily, thiamine . He does have some neck pain to the left side, into left shoulder, since the stroke, is minor to him. MRI mentioned questioned if lesion involving the C4 vertebral body, recommended MRI cervical spine with and without contrast.   HISTORY  Admitted 01/08/2024-01/12/24 for left-sided weakness with right thalamic ICH.  Reports drinking 1/5 of liquor daily.  NIH on admission 4.  Amlodipine  was increased from 5 mg to 10 mg daily.  Crestor  20 mg was added.  On discharge mild dysarthria, mild left facial droop, left upper extremity 4/5 with ataxia, left lower extremity 3/5.  Discharge to SNF.  - CT head focal hemorrhage in the right thalamus - Repeat CT head 5/9 unchanged acute hemorrhage within right thalamus, generalized atrophy - MRI of the brain showed ICH. - MRA head and neck unremarkable - LDL 137 - A1c 4.3. - No antithrombotic prior to admission, no antithrombotics secondary to ICH  REVIEW OF SYSTEMS: Out of a complete 14 system review of symptoms, the patient complains only of the following symptoms, and all other reviewed systems are negative.  See HPI  ALLERGIES: No Known Allergies  HOME MEDICATIONS: Outpatient Medications Prior to Visit  Medication Sig Dispense Refill   acetaminophen  (TYLENOL ) 325 MG tablet Take 650 mg by mouth every 6 (six) hours as needed.     allopurinol  (ZYLOPRIM ) 300 MG tablet Take 1 tablet (300 mg total) by mouth daily. 30 tablet 5   amLODipine  (NORVASC ) 10 MG tablet Take 1 tablet (10 mg total) by mouth daily.  hydrALAZINE  (APRESOLINE ) 25 MG tablet Take 1 tablet (25 mg total) by mouth 3 (three) times daily as needed (is sbp>160).     Multiple Vitamin (MULTIVITAMIN WITH MINERALS) TABS tablet Take 1 tablet by mouth daily.     pantoprazole  (PROTONIX ) 40 MG tablet Take 1 tablet (40 mg total) by mouth at bedtime.     senna-docusate (SENOKOT-S) 8.6-50 MG tablet Take 1 tablet by mouth 2  (two) times daily.     thiamine  (VITAMIN B-1) 100 MG tablet Take 1 tablet (100 mg total) by mouth daily.     rosuvastatin  (CRESTOR ) 20 MG tablet Take 1 tablet (20 mg total) by mouth daily. (Patient not taking: Reported on 03/02/2024)     Facility-Administered Medications Prior to Visit  Medication Dose Route Frequency Provider Last Rate Last Admin   triamcinolone  acetonide (KENALOG -40) injection 40 mg  40 mg Intramuscular Once Christopher Savannah, PA-C        PAST MEDICAL HISTORY: Past Medical History:  Diagnosis Date   Arthritis    lower back with muscle spasms   Gout    Hypertension     PAST SURGICAL HISTORY: Past Surgical History:  Procedure Laterality Date   WRIST SURGERY  1980s   (RIGHT)   Accident at work- Location manager at [employer]    FAMILY HISTORY: Family History  Problem Relation Age of Onset   Kidney disease Mother        kidney stones   Stroke Maternal Uncle 60   Stroke Sister 25   Hypertension Sister    Stroke Brother 59   Hypertension Brother    Cirrhosis Brother     SOCIAL HISTORY: Social History   Socioeconomic History   Marital status: Single    Spouse name: Not on file   Number of children: Not on file   Years of education: Not on file   Highest education level: Not on file  Occupational History   Not on file  Tobacco Use   Smoking status: Never   Smokeless tobacco: Never  Vaping Use   Vaping status: Never Used  Substance and Sexual Activity   Alcohol use: Yes    Alcohol/week: 5.0 - 6.0 standard drinks of alcohol    Types: 5 - 6 Standard drinks or equivalent per week   Drug use: No   Sexual activity: Not on file  Other Topics Concern   Not on file  Social History Narrative   Not on file   Social Drivers of Health   Financial Resource Strain: Not on file  Food Insecurity: Food Insecurity Present (01/09/2024)   Hunger Vital Sign    Worried About Running Out of Food in the Last Year: Sometimes true    Ran Out of Food in the Last Year:  Sometimes true  Transportation Needs: Unmet Transportation Needs (01/09/2024)   PRAPARE - Administrator, Civil Service (Medical): Yes    Lack of Transportation (Non-Medical): Yes  Physical Activity: Not on file  Stress: Not on file  Social Connections: Unknown (01/09/2024)   Social Connection and Isolation Panel    Frequency of Communication with Friends and Family: Once a week    Frequency of Social Gatherings with Friends and Family: More than three times a week    Attends Religious Services: Patient declined    Active Member of Clubs or Organizations: No    Attends Banker Meetings: Never    Marital Status: Never married  Intimate Partner Violence: Not At Risk (01/09/2024)  Humiliation, Afraid, Rape, and Kick questionnaire    Fear of Current or Ex-Partner: No    Emotionally Abused: No    Physically Abused: No    Sexually Abused: No   PHYSICAL EXAM  Vitals:   03/02/24 0937  BP: 121/69  Pulse: 78  Weight: 150 lb (68 kg)  Height: 5' 8 (1.727 m)   Body mass index is 22.81 kg/m.  Generalized: Well developed, in no acute distress  Neurological examination  Mentation: Alert oriented to time, place, history taking.  Mild dysarthria, with poor dental health Cranial nerve II-XII: Pupils were equal round reactive to light. Extraocular movements were full, visual field were full on confrontational test.  Mild left-sided facial droop.  Decreased shoulder shrug on the left. Motor: 4/5 left upper extremity strength, 3/5 left lower extremity strength.  Increased tone to the left leg. Sensory: Sensory testing is intact to soft touch on all 4 extremities. No evidence of extinction is noted.  Coordination: Dysmetria with finger-nose-finger using the left. Gait and station: Was not ambulated today, seated in wheelchair, did not feel comfortable standing. Reflexes: Mild increase on the left side.  DIAGNOSTIC DATA (LABS, IMAGING, TESTING) - I reviewed patient records,  labs, notes, testing and imaging myself where available.  Lab Results  Component Value Date   WBC 11.8 (H) 01/13/2024   HGB 10.8 (L) 01/13/2024   HCT 30.9 (L) 01/13/2024   MCV 99.4 01/13/2024   PLT 251 01/13/2024      Component Value Date/Time   NA 132 (L) 01/13/2024 0608   K 4.3 01/13/2024 0608   CL 101 01/13/2024 0608   CO2 22 01/13/2024 0608   GLUCOSE 96 01/13/2024 0608   BUN 16 01/13/2024 0608   CREATININE 1.19 01/13/2024 0608   CREATININE 1.15 12/01/2013 1216   CALCIUM  9.3 01/13/2024 0608   PROT 7.4 01/09/2024 0019   ALBUMIN 2.4 (L) 01/09/2024 0019   AST 73 (H) 01/09/2024 0019   ALT 40 01/09/2024 0019   ALKPHOS 87 01/09/2024 0019   BILITOT 1.7 (H) 01/09/2024 0019   GFRNONAA >60 01/13/2024 0608   GFRAA 78 (L) 12/01/2013 1410   Lab Results  Component Value Date   CHOL 193 01/10/2024   HDL 39 (L) 01/10/2024   LDLCALC 137 (H) 01/10/2024   TRIG 84 01/10/2024   CHOLHDL 4.9 01/10/2024   Lab Results  Component Value Date   HGBA1C 4.3 (L) 01/10/2024   No results found for: [facility] Lab Results  Component Value Date   TSH 1.708 02/03/2013    Lauraine Born, AGNP-C, DNP 03/02/2024, 9:52 AM [facility] [address], [address] [place], [place] [address] [phone]

## 2024-03-02 ENCOUNTER — Ambulatory Visit (INDEPENDENT_AMBULATORY_CARE_PROVIDER_SITE_OTHER): Admitting: Neurology

## 2024-03-02 ENCOUNTER — Encounter: Payer: Self-pay | Admitting: Neurology

## 2024-03-02 VITALS — BP 121/69 | HR 78 | Ht 68.0 in | Wt 150.0 lb

## 2024-03-02 DIAGNOSIS — I610 Nontraumatic intracerebral hemorrhage in hemisphere, subcortical: Secondary | ICD-10-CM

## 2024-03-02 NOTE — Patient Instructions (Addendum)
 Check MRI cervical spine:  4. Question 1.5 cm T1 hypointense lesion involving the C4 vertebral body. No visible lesion to correspond with this finding seen on prior CT. Finding is indeterminate, and could be related underlying spondylosis, although correlation with dedicated MRI of the cervical spine, with and without contrast, suggested for further evaluation.  Continue physical therapy, continue exercise. Continue to work with the left leg for strength and mobility.   Strict management of vascular risk factors with a goal BP less than 130/90, A1c less than 7.0, LDL less than 70 for secondary stroke prevention  Continue to avoid alcohol consumption   Follow up in 6 months

## 2024-03-03 ENCOUNTER — Telehealth: Payer: Self-pay | Admitting: Neurology

## 2024-03-03 ENCOUNTER — Ambulatory Visit: Payer: Self-pay | Admitting: Neurology

## 2024-03-03 LAB — BASIC METABOLIC PANEL WITH GFR
BUN/Creatinine Ratio: 12 (ref 10–24)
BUN: 15 mg/dL (ref 8–27)
CO2: 17 mmol/L — ABNORMAL LOW (ref 20–29)
Calcium: 10 mg/dL (ref 8.6–10.2)
Chloride: 102 mmol/L (ref 96–106)
Creatinine, Ser: 1.22 mg/dL (ref 0.76–1.27)
Glucose: 100 mg/dL — ABNORMAL HIGH (ref 70–99)
Potassium: 4.1 mmol/L (ref 3.5–5.2)
Sodium: 138 mmol/L (ref 134–144)
eGFR: 63 mL/min/{1.73_m2} (ref >59)

## 2024-03-03 NOTE — Telephone Encounter (Signed)
Patient called.  Left message for patient to call back.

## 2024-03-03 NOTE — Telephone Encounter (Signed)
 Cody Bautista: [id number] exp. 03/03/24-05/02/24 sent to GI [phone]

## 2024-03-03 NOTE — Telephone Encounter (Signed)
-----   Message from Lauraine JINNY Born sent at 03/03/2024  5:59 AM EDT ----- Kidney function should be okay for contrast, he should drink plenty of water before and after MRI. Thanks. Please fax labs over to facility.  ----- Message ----- From: Interface, [facility] Lab Results In Sent: 03/03/2024   5:37 AM EDT To: Lauraine JINNY Born, NP

## 2024-03-04 NOTE — Progress Notes (Signed)
 I agree with the above plan

## 2024-09-28 ENCOUNTER — Telehealth: Payer: Self-pay | Admitting: Neurology

## 2024-09-28 ENCOUNTER — Ambulatory Visit: Admitting: Neurology

## 2024-09-28 NOTE — Telephone Encounter (Signed)
 LVM informing pt to call back to reschedule his 1/27 appt due to the weather.

## 2024-09-29 ENCOUNTER — Ambulatory Visit: Admitting: Neurology

## 2024-09-29 VITALS — BP 151/80 | HR 74 | Ht 68.0 in | Wt 179.0 lb

## 2024-09-29 DIAGNOSIS — I610 Nontraumatic intracerebral hemorrhage in hemisphere, subcortical: Secondary | ICD-10-CM

## 2024-09-29 NOTE — Progress Notes (Signed)
 "  Patient: Cody Bautista Date of Birth: 01/09/1951  Reason for Visit: Stroke clinic follow-up History from: Patient Primary Neurologist: Rosemarie   ASSESSMENT AND PLAN 74 y.o. 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  Vascular risk factors: HTN, HLD, alcohol abuse.  Now staying at SNF, no longer drinking alcohol.  Significantly improved, can now walk with a cane, continues with mild weakness to the left side.  -Never had MRI cervical spine, order is still good, # for [facility] written on facility paperwork to call and schedule, check BMP today before contrast -MRI imaging during hospitalization recommended dedicated MRI cervical spine for questionable hypointense lesion at the C4 vertebral body.   4. Question 1.5 cm T1 hypointense lesion involving the C4 vertebral body. No visible lesion to correspond with this finding seen on prior CT. Finding is indeterminate, and could be related underlying spondylosis, although correlation with dedicated MRI of the cervical spine, with and without contrast, suggested for further evaluation. -Strict management of vascular risk factors with a goal BP less than 130/90, A1c less than 7.0, LDL less than 70 for secondary stroke prevention - Continue follow with PCP, return here as needed   HISTORY OF PRESENT ILLNESS: Today 09/29/24 09/29/24 SS: Last visit I ordered MRI cervical spine for questionable lesion at C4 vertebral body, never had this. Continues to live at [facility]. Transportation brought him. Improving, using cane. Left leg nearly back to normal, left arm is slightly weak still. Takes fluid pills for swelling in the legs. He does have limited ROM of left shoulder only to shoulder level. Finished PT. BP was a 151/80. Eating is fine, needs dentures, no trouble swallowing. He thinks he has really made good improvement, gets 3 meals a day.   03/02/24 SS: Here today alone, was brought by [facility]. Prior he was  renting a room from a friend, he was driving a car. Prior he was not taking care of himself or seeing a doctor regularly. Admits he was drinking too much. He is no longer drinking. He continues with weakness to left arm and leg. Walking is hard, back in physical therapy. He can uses a walker, but mainly uses a wheelchair, he can scoot with his feet. Mild slurred speech. Eating well, gets 3 meals a day. BP was good 121/69, on Amlodipine  10 mg daily, Lipitor 40 mg daily, thiamine . He does have some neck pain to the left side, into left shoulder, since the stroke, is minor to him. MRI mentioned questioned if lesion involving the C4 vertebral body, recommended MRI cervical spine with and without contrast.   HISTORY  Admitted 01/08/2024-01/12/24 for left-sided weakness with right thalamic ICH.  Reports drinking 1/5 of liquor daily.  NIH on admission 4.  Amlodipine  was increased from 5 mg to 10 mg daily.  Crestor  20 mg was added.  On discharge mild dysarthria, mild left facial droop, left upper extremity 4/5 with ataxia, left lower extremity 3/5.  Discharge to SNF.  - CT head focal hemorrhage in the right thalamus - Repeat CT head 5/9 unchanged acute hemorrhage within right thalamus, generalized atrophy - MRI of the brain showed ICH. - MRA head and neck unremarkable - LDL 137 - A1c 4.3. - No antithrombotic prior to admission, no antithrombotics secondary to ICH  REVIEW OF SYSTEMS: Out of a complete 14 system review of symptoms, the patient complains only of the following symptoms, and all other reviewed systems are negative.  See HPI  ALLERGIES: No Known  Allergies  HOME MEDICATIONS: Outpatient Medications Prior to Visit  Medication Sig Dispense Refill   acetaminophen  (TYLENOL ) 325 MG tablet Take 650 mg by mouth every 6 (six) hours as needed.     allopurinol  (ZYLOPRIM ) 300 MG tablet Take 1 tablet (300 mg total) by mouth daily. 30 tablet 5   amLODipine  (NORVASC ) 10 MG tablet Take 1 tablet (10 mg total)  by mouth daily.     atorvastatin (LIPITOR) 40 MG tablet Take 40 mg by mouth daily.     hydrALAZINE  (APRESOLINE ) 25 MG tablet Take 1 tablet (25 mg total) by mouth 3 (three) times daily as needed (is sbp>160).     Multiple Vitamin (MULTIVITAMIN WITH MINERALS) TABS tablet Take 1 tablet by mouth daily.     pantoprazole  (PROTONIX ) 40 MG tablet Take 1 tablet (40 mg total) by mouth at bedtime.     senna-docusate (SENOKOT-S) 8.6-50 MG tablet Take 1 tablet by mouth 2 (two) times daily.     thiamine  (VITAMIN B-1) 100 MG tablet Take 1 tablet (100 mg total) by mouth daily.     Facility-Administered Medications Prior to Visit  Medication Dose Route Frequency Provider Last Rate Last Admin   triamcinolone  acetonide (KENALOG -40) injection 40 mg  40 mg Intramuscular Once Christopher Savannah, PA-C        PAST MEDICAL HISTORY: Past Medical History:  Diagnosis Date   Arthritis    lower back with muscle spasms   Gout    Hypertension     PAST SURGICAL HISTORY: Past Surgical History:  Procedure Laterality Date   WRIST SURGERY  1980s   (RIGHT)   Accident at work- location manager at [employer]    FAMILY HISTORY: Family History  Problem Relation Age of Onset   Kidney disease Mother        kidney stones   Stroke Maternal Uncle 60   Stroke Sister 72   Hypertension Sister    Stroke Brother 59   Hypertension Brother    Cirrhosis Brother     SOCIAL HISTORY: Social History   Socioeconomic History   Marital status: Single    Spouse name: Not on file   Number of children: Not on file   Years of education: Not on file   Highest education level: Not on file  Occupational History   Not on file  Tobacco Use   Smoking status: Never   Smokeless tobacco: Never  Vaping Use   Vaping status: Never Used  Substance and Sexual Activity   Alcohol use: Yes    Alcohol/week: 5.0 - 6.0 standard drinks of alcohol    Types: 5 - 6 Standard drinks or equivalent per week   Drug use: No   Sexual activity: Not on file   Other Topics Concern   Not on file  Social History Narrative   Not on file   Social Drivers of Health   Tobacco Use: Low Risk (03/02/2024)   Patient History    Smoking Tobacco Use: Never    Smokeless Tobacco Use: Never    Passive Exposure: Not on file  Financial Resource Strain: Not on file  Food Insecurity: Food Insecurity Present (01/09/2024)   Hunger Vital Sign    Worried About Running Out of Food in the Last Year: Sometimes true    Ran Out of Food in the Last Year: Sometimes true  Transportation Needs: Unmet Transportation Needs (01/09/2024)   PRAPARE - Administrator, Civil Service (Medical): Yes    Lack of Transportation (Non-Medical): Yes  Physical Activity: Not on file  Stress: Not on file  Social Connections: Unknown (01/09/2024)   Social Connection and Isolation Panel    Frequency of Communication with Friends and Family: Once a week    Frequency of Social Gatherings with Friends and Family: More than three times a week    Attends Religious Services: Patient declined    Database Administrator or Organizations: No    Attends Banker Meetings: Never    Marital Status: Never married  Intimate Partner Violence: Not At Risk (01/09/2024)   Humiliation, Afraid, Rape, and Kick questionnaire    Fear of Current or Ex-Partner: No    Emotionally Abused: No    Physically Abused: No    Sexually Abused: No  Depression (PHQ2-9): Not on file  Alcohol Screen: Not on file  Housing: High Risk (01/09/2024)   Housing Stability Vital Sign    Unable to Pay for Housing in the Last Year: Yes    Number of Times Moved in the Last Year: 4    Homeless in the Last Year: No  Utilities: At Risk (01/09/2024)   [employer] Utilities    Threatened with loss of utilities: Yes  Health Literacy: Not on file   PHYSICAL EXAM  Vitals:   09/29/24 1254  BP: (!) 151/80  Pulse: 74  Weight: 179 lb (81.2 kg)  Height: 5' 8 (1.727 m)   Body mass index is 27.22 kg/m.  Generalized: Well  developed, in no acute distress  Neurological examination  Mentation: Alert oriented to time, place, history taking.  Mild dysarthria, with poor dental health Cranial nerve II-XII: Pupils were equal round reactive to light. Extraocular movements were full, visual field were full on confrontational test.  Mild left-sided facial droop.  Decreased shoulder shrug on the left. Motor: 4/5 left upper extremity strength, 4/5 left lower extremity strength.  Mild increased tone to the left leg. Sensory: Sensory testing is intact to soft touch on all 4 extremities. No evidence of extinction is noted.  Coordination: Dysmetria with finger-nose-finger using the left. Gait and station: Using cane, drags the left leg, decreased arm swing on the left Reflexes: Mild increase on the left side.  DIAGNOSTIC DATA (LABS, IMAGING, TESTING) - I reviewed patient records, labs, notes, testing and imaging myself where available.  Lab Results  Component Value Date   WBC 11.8 (H) 01/13/2024   HGB 10.8 (L) 01/13/2024   HCT 30.9 (L) 01/13/2024   MCV 99.4 01/13/2024   PLT 251 01/13/2024      Component Value Date/Time   NA 138 03/02/2024 1016   K 4.1 03/02/2024 1016   CL 102 03/02/2024 1016   CO2 17 (L) 03/02/2024 1016   GLUCOSE 100 (H) 03/02/2024 1016   GLUCOSE 96 01/13/2024 0608   BUN 15 03/02/2024 1016   CREATININE 1.22 03/02/2024 1016   CREATININE 1.15 12/01/2013 1216   CALCIUM  10.0 03/02/2024 1016   PROT 7.4 01/09/2024 0019   ALBUMIN 2.4 (L) 01/09/2024 0019   AST 73 (H) 01/09/2024 0019   ALT 40 01/09/2024 0019   ALKPHOS 87 01/09/2024 0019   BILITOT 1.7 (H) 01/09/2024 0019   GFRNONAA >60 01/13/2024 0608   GFRAA 78 (L) 12/01/2013 1410   Lab Results  Component Value Date   CHOL 193 01/10/2024   HDL 39 (L) 01/10/2024   LDLCALC 137 (H) 01/10/2024   TRIG 84 01/10/2024   CHOLHDL 4.9 01/10/2024   Lab Results  Component Value Date   HGBA1C 4.3 (L) 01/10/2024  No results found for: [id number] Lab  Results  Component Value Date   TSH 1.708 02/03/2013    Lauraine Born, AGNP-C, DNP 09/29/2024, 1:27 PM [facility] [address] [place], [place] [address] [phone]  "

## 2024-09-29 NOTE — Patient Instructions (Addendum)
 Great to see you today! Check MRI cervical spine, call [facility] to schedule  [phone] Strict management of vascular risk factors with a goal BP less than 130/90, A1c less than 7.0, LDL less than 70 for secondary stroke prevention Continue follow-up with facility primary care Return here as needed

## 2024-09-30 ENCOUNTER — Ambulatory Visit: Payer: Self-pay | Admitting: Neurology

## 2024-09-30 LAB — BASIC METABOLIC PANEL WITH GFR
BUN/Creatinine Ratio: 14 (ref 10–24)
BUN: 20 mg/dL (ref 8–27)
CO2: 22 mmol/L (ref 20–29)
Calcium: 9.4 mg/dL (ref 8.6–10.2)
Chloride: 104 mmol/L (ref 96–106)
Creatinine, Ser: 1.45 mg/dL — ABNORMAL HIGH (ref 0.76–1.27)
Glucose: 87 mg/dL (ref 70–99)
Potassium: 4 mmol/L (ref 3.5–5.2)
Sodium: 139 mmol/L (ref 134–144)
eGFR: 52 mL/min/{1.73_m2} — ABNORMAL LOW

## 2024-09-30 NOTE — Telephone Encounter (Signed)
 Please call facility. MRI ordered with contrast of cervical spine. Labs checked yesterday, creatinine elevated 1.45. Would recommend recheck BMP, can be done at facility before MRI with contrast. Please see if provider there can order and collect. Recommend patient increase water intake. Thanks

## 2024-10-04 NOTE — Telephone Encounter (Signed)
 Called and spoke to pt son and he stated that his father is in [facility].   I then proceeded to call [facility] at [phone] and remained on hold for 16 plus minutes and n o one came to the phone.   We will fax this over directly since no one from the facility was able to answer the phone  DEBRA,  Can we fax this result and SARAHS recommendation to them? We may need to send result and copay of this phone call that includes her commentary. Send to [facility]
